# Patient Record
Sex: Male | Born: 1965 | Race: White | Hispanic: Yes | Smoking: Current every day smoker
Health system: Southern US, Community
[De-identification: ages and names within clinical notes are randomized; demographics above are authoritative.]

## PROBLEM LIST (undated history)

## (undated) ENCOUNTER — Ambulatory Visit (HOSPITAL_COMMUNITY): Admission: EM | Discharge status: Absent without leave | Payer: Non-veteran care

## (undated) DIAGNOSIS — E119 Type 2 diabetes mellitus without complications: Secondary | ICD-10-CM

## (undated) DIAGNOSIS — I10 Essential (primary) hypertension: Secondary | ICD-10-CM

## (undated) DIAGNOSIS — R079 Chest pain, unspecified: Secondary | ICD-10-CM

## (undated) DIAGNOSIS — E785 Hyperlipidemia, unspecified: Secondary | ICD-10-CM

## (undated) DIAGNOSIS — N200 Calculus of kidney: Secondary | ICD-10-CM

## (undated) DIAGNOSIS — E876 Hypokalemia: Secondary | ICD-10-CM

## (undated) DIAGNOSIS — I160 Hypertensive urgency: Secondary | ICD-10-CM

## (undated) HISTORY — DX: Essential (primary) hypertension: I10

## (undated) HISTORY — DX: Chest pain, unspecified: R07.9

## (undated) HISTORY — DX: Type 2 diabetes mellitus without complications: E11.9

## (undated) HISTORY — DX: Hypertensive urgency: I16.0

## (undated) HISTORY — PX: CHOLECYSTECTOMY: SHX55

## (undated) HISTORY — DX: Hypokalemia: E87.6

---

## 2018-05-04 ENCOUNTER — Encounter (HOSPITAL_COMMUNITY): Payer: Self-pay | Admitting: Emergency Medicine

## 2018-05-04 ENCOUNTER — Emergency Department (HOSPITAL_COMMUNITY)
Admission: EM | Admit: 2018-05-04 | Discharge: 2018-05-05 | Disposition: A | Discharge status: Home / Self Care | Payer: Non-veteran care | Attending: Emergency Medicine | Admitting: Emergency Medicine

## 2018-05-04 DIAGNOSIS — Z7984 Long term (current) use of oral hypoglycemic drugs: Secondary | ICD-10-CM | POA: Insufficient documentation

## 2018-05-04 DIAGNOSIS — I10 Essential (primary) hypertension: Secondary | ICD-10-CM | POA: Diagnosis not present

## 2018-05-04 DIAGNOSIS — M545 Low back pain, unspecified: Secondary | ICD-10-CM

## 2018-05-04 DIAGNOSIS — E1165 Type 2 diabetes mellitus with hyperglycemia: Secondary | ICD-10-CM | POA: Diagnosis not present

## 2018-05-04 DIAGNOSIS — F17210 Nicotine dependence, cigarettes, uncomplicated: Secondary | ICD-10-CM | POA: Insufficient documentation

## 2018-05-04 DIAGNOSIS — R739 Hyperglycemia, unspecified: Secondary | ICD-10-CM

## 2018-05-04 NOTE — ED Triage Notes (Signed)
Patient reports left lower back pain for 1 weeks , denies injury or fall , no dysuria or hematuria , pain increases with movement / changing positions .

## 2018-05-05 LAB — BASIC METABOLIC PANEL WITH GFR
Anion gap: 10 (ref 5–15)
BUN: 9 mg/dL (ref 6–20)
CO2: 23 mmol/L (ref 22–32)
Calcium: 9.2 mg/dL (ref 8.9–10.3)
Chloride: 102 mmol/L (ref 98–111)
Creatinine, Ser: 0.74 mg/dL (ref 0.61–1.24)
GFR calc Af Amer: >60 mL/min (ref >60)
GFR calc non Af Amer: >60 mL/min (ref >60)
Glucose, Bld: 249 mg/dL — ABNORMAL HIGH (ref 70–99)
Potassium: 3.3 mmol/L — ABNORMAL LOW (ref 3.5–5.1)
Sodium: 135 mmol/L (ref 135–145)

## 2018-05-05 LAB — CBC WITH DIFFERENTIAL/PLATELET
ABS IMMATURE GRANULOCYTES: 0.05 10*3/uL (ref 0.00–0.07)
BASOS ABS: 0.1 10*3/uL (ref 0.0–0.1)
Basophils Relative: 1 %
Eosinophils Absolute: 0.1 10*3/uL (ref 0.0–0.5)
Eosinophils Relative: 1 %
HEMATOCRIT: 50.4 % (ref 39.0–52.0)
HEMOGLOBIN: 17.3 g/dL — AB (ref 13.0–17.0)
IMMATURE GRANULOCYTES: 1 %
LYMPHS ABS: 2.9 10*3/uL (ref 0.7–4.0)
LYMPHS PCT: 28 %
MCH: 29.1 pg (ref 26.0–34.0)
MCHC: 34.3 g/dL (ref 30.0–36.0)
MCV: 84.8 fL (ref 80.0–100.0)
MONOS PCT: 8 %
Monocytes Absolute: 0.9 10*3/uL (ref 0.1–1.0)
NEUTROS ABS: 6.5 10*3/uL (ref 1.7–7.7)
NEUTROS PCT: 61 %
NRBC: 0 % (ref 0.0–0.2)
Platelets: 410 10*3/uL — ABNORMAL HIGH (ref 150–400)
RBC: 5.94 MIL/uL — ABNORMAL HIGH (ref 4.22–5.81)
RDW: 12.2 % (ref 11.5–15.5)
WBC: 10.6 10*3/uL — ABNORMAL HIGH (ref 4.0–10.5)

## 2018-05-05 LAB — URINALYSIS, ROUTINE W REFLEX MICROSCOPIC
BACTERIA UA: NONE SEEN
Bilirubin Urine: NEGATIVE
Hgb urine dipstick: NEGATIVE
KETONES UR: 20 mg/dL — AB
LEUKOCYTES UA: NEGATIVE
Nitrite: NEGATIVE
PH: 5 (ref 5.0–8.0)
Protein, ur: 30 mg/dL — AB
SPECIFIC GRAVITY, URINE: 1.038 — AB (ref 1.005–1.030)

## 2018-05-05 MED ORDER — LIDOCAINE 5 % EX PTCH: 1.0000 | MEDICATED_PATCH | CUTANEOUS | 0 refills | Status: DC

## 2018-05-05 MED ORDER — METHOCARBAMOL 500 MG PO TABS: 500.0000 mg | ORAL_TABLET | Freq: Three times a day (TID) | ORAL | 0 refills | Status: DC | PRN

## 2018-05-05 MED ORDER — LIDOCAINE 5 % EX PTCH
1.0000 | MEDICATED_PATCH | CUTANEOUS | Status: DC
  Administered 2018-05-05: 1 via TRANSDERMAL
  Filled 2018-05-05: qty 1

## 2018-05-05 MED ORDER — NAPROXEN 500 MG PO TABS: 500.0000 mg | ORAL_TABLET | Freq: Two times a day (BID) | ORAL | 0 refills | Status: DC | PRN

## 2018-05-05 MED ORDER — METFORMIN HCL 500 MG PO TABS: 1000.0000 mg | ORAL_TABLET | Freq: Two times a day (BID) | ORAL | 1 refills | Status: DC

## 2018-05-05 MED ORDER — METHOCARBAMOL 500 MG PO TABS
500.0000 mg | ORAL_TABLET | Freq: Once | ORAL | Status: AC
  Administered 2018-05-05: 500 mg via ORAL
  Filled 2018-05-05: qty 1

## 2018-05-05 MED ORDER — OXYCODONE-ACETAMINOPHEN 5-325 MG PO TABS
1.0000 | ORAL_TABLET | Freq: Once | ORAL | Status: AC
  Administered 2018-05-05: 1 via ORAL
  Filled 2018-05-05: qty 1

## 2018-05-05 MED ORDER — KETOROLAC TROMETHAMINE 60 MG/2ML IM SOLN
60.0000 mg | Freq: Once | INTRAMUSCULAR | Status: AC
  Administered 2018-05-05: 60 mg via INTRAMUSCULAR
  Filled 2018-05-05: qty 2

## 2018-05-05 NOTE — Discharge Instructions (Signed)
Alternate ice and heat to areas of injury 3-4 times per day to limit inflammation and spasm.  Avoid strenuous activity and heavy lifting.  We recommend consistent use of naproxen in addition to Robaxin for muscle spasms.  Do not drive or drink alcohol after taking Robaxin as it may make you drowsy and impair your judgment.  You have also been prescribed topical lidoderm patches for pain control.  We recommend follow-up with a primary care doctor to ensure resolution of symptoms.  Return to the ED for any new or concerning symptoms.

## 2018-05-05 NOTE — ED Provider Notes (Signed)
[facility] EMERGENCY DEPARTMENT Provider Note   CSN: [id number] Arrival date & time: 05/04/18  2334     History   Chief Complaint Chief Complaint  Patient presents with  . Back Pain    HPI Dave Moran is a 52 y.o. male.  52 year old male with history of hypertension and diabetes presents to the emergency department for evaluation of left lower back pain.  Reports that pain is been present for the past week.  It is aggravated with certain movements as well as ambulation.  He has also noted worsening pain with palpation to the area.  The patient has tried taking acetaminophen for symptoms without relief.  Denies any fall, injury, trauma.  Does not frequently engage in heavy lifting, but is on his feet a lot at work.  Denies history of chronic low back pain.  He has not had any fevers, nausea, vomiting, dysuria, hematuria, bowel or bladder incontinence.  No history of cancer or IV drug use.  He recently relocated to the area and has yet to establish care with a PCP.  The history is provided by the patient. No language interpreter was used.  Back Pain      Past Medical History:  Diagnosis Date  . Diabetes mellitus without complication (HCC)   . Hypertension     There are no active problems to display for this patient.   Past Surgical History:  Procedure Laterality Date  . CHOLECYSTECTOMY        Home Medications    Prior to Admission medications   Medication Sig Start Date End Date Taking? Authorizing Provider  lidocaine (LIDODERM) 5 % Place 1 patch onto the skin daily. Remove & Discard patch within 12 hours or as directed by MD 05/05/18   Antony Madura, PA-C  metFORMIN (GLUCOPHAGE) 500 MG tablet Take 2 tablets (1,000 mg total) by mouth 2 (two) times daily with a meal. 05/05/18   Antony Madura, PA-C  methocarbamol (ROBAXIN) 500 MG tablet Take 1 tablet (500 mg total) by mouth every 8 (eight) hours as needed for muscle spasms. 05/05/18   Antony Madura, PA-C    naproxen (NAPROSYN) 500 MG tablet Take 1 tablet (500 mg total) by mouth every 12 (twelve) hours as needed for mild pain or moderate pain. 05/05/18   Antony Madura, PA-C    Family History No family history on file.  Social History Social History   Tobacco Use  . Smoking status: Current Every Day Smoker  . Smokeless tobacco: Never Used  Substance Use Topics  . Alcohol use: Yes  . Drug use: Never     Allergies   Patient has no known allergies.   Review of Systems Review of Systems  Musculoskeletal: Positive for back pain.  Ten systems reviewed and are negative for acute change, except as noted in the HPI.    Physical Exam Updated Vital Signs BP 132/88 (BP Location: Left Arm)   Pulse 75   Temp 98.8 F (37.1 C) (Oral)   Resp 16   SpO2 98%   Physical Exam  Constitutional: He is oriented to person, place, and time. He appears well-developed and well-nourished. No distress.  Nontoxic appearing and in NAD  HENT:  Head: Normocephalic and atraumatic.  Eyes: Conjunctivae and EOM are normal. No scleral icterus.  Neck: Normal range of motion.  Pulmonary/Chest: Effort normal. No respiratory distress.  Respirations even and unlabored.  Musculoskeletal: Normal range of motion.  TTP to the left lumbar paraspinal muscles. No TTP to the  lumbosacral midline. No bony deformities, step offs, crepitus. Negative straight leg raise and crossed straight leg raise.  Neurological: He is alert and oriented to person, place, and time. He exhibits normal muscle tone. Coordination normal.  Ambulatory with antalgic gait. Sensation to light touch intact.  Skin: Skin is warm and dry. No rash noted. He is not diaphoretic. No erythema. No pallor.  Psychiatric: He has a normal mood and affect. His behavior is normal.  Nursing note and vitals reviewed.    ED Treatments / Results  Labs (all labs ordered are listed, but only abnormal results are displayed) Labs Reviewed  URINALYSIS, ROUTINE W  REFLEX MICROSCOPIC - Abnormal; Notable for the following components:      Result Value   Specific Gravity, Urine 1.038 (*)    Glucose, UA >=500 (*)    Ketones, ur 20 (*)    Protein, ur 30 (*)    All other components within normal limits  CBC WITH DIFFERENTIAL/PLATELET - Abnormal; Notable for the following components:   WBC 10.6 (*)    RBC 5.94 (*)    Hemoglobin 17.3 (*)    Platelets 410 (*)    All other components within normal limits  BASIC METABOLIC PANEL - Abnormal; Notable for the following components:   Potassium 3.3 (*)    Glucose, Bld 249 (*)    All other components within normal limits    EKG None  Radiology No results found.  Procedures Procedures (including critical care time)  Medications Ordered in ED Medications  lidocaine (LIDODERM) 5 % 1 patch (1 patch Transdermal Patch Applied 05/05/18 0214)  oxyCODONE-acetaminophen (PERCOCET/ROXICET) 5-325 MG per tablet 1 tablet (1 tablet Oral Given 05/05/18 0214)  ketorolac (TORADOL) injection 60 mg (60 mg Intramuscular Given 05/05/18 0214)  methocarbamol (ROBAXIN) tablet 500 mg (500 mg Oral Given 05/05/18 0214)    2:31 AM Patient reports some improvement with symptomatic medications. Will discharge with outpatient supportive care instructions.   Initial Impression / Assessment and Plan / ED Course  I have reviewed the triage vital signs and the nursing notes.  Pertinent labs & imaging results that were available during my care of the patient were reviewed by me and considered in my medical decision making (see chart for details).     Patient with back pain x 1 week.  Patient neurovascularly intact.  He can walk but states is painful.  No loss of bowel or bladder control.  No concern for cauda equina.  No fever, h/o cancer, IVDU.  Symptoms improving in the ED with IM and PO medications.  RICE protocol and pain medicine indicated and discussed with patient.  Return precautions discussed and provided. Patient discharged  in stable condition with no unaddressed concerns.  Vitals:   05/04/18 2354 05/05/18 0246  BP: (!) 176/102 132/88  Pulse: 80 75  Resp: 16 16  Temp: 98.8 F (37.1 C)   TempSrc: Oral   SpO2: 100% 98%    Final Clinical Impressions(s) / ED Diagnoses   Final diagnoses:  Acute left-sided low back pain without sciatica  Hyperglycemia    ED Discharge Orders         Ordered    naproxen (NAPROSYN) 500 MG tablet  Every 12 hours PRN     05/05/18 0233    methocarbamol (ROBAXIN) 500 MG tablet  Every 8 hours PRN     05/05/18 0233    lidocaine (LIDODERM) 5 %  Every 24 hours     05/05/18 0233    metFORMIN (  GLUCOPHAGE) 500 MG tablet  2 times daily with meals     05/05/18 0233           Antony Madura, PA-C 05/05/18 0259    Gilda Crease, MD 05/05/18 0700

## 2018-07-09 ENCOUNTER — Other Ambulatory Visit: Payer: Self-pay

## 2018-07-09 ENCOUNTER — Observation Stay (HOSPITAL_COMMUNITY)
Admission: EM | Admit: 2018-07-09 | Discharge: 2018-07-10 | Disposition: A | Discharge status: Home / Self Care | Payer: 59 | Attending: Family Medicine | Admitting: Family Medicine

## 2018-07-09 ENCOUNTER — Encounter (HOSPITAL_COMMUNITY): Payer: Self-pay | Admitting: Emergency Medicine

## 2018-07-09 ENCOUNTER — Emergency Department (HOSPITAL_COMMUNITY): Payer: 59

## 2018-07-09 DIAGNOSIS — Z79899 Other long term (current) drug therapy: Secondary | ICD-10-CM | POA: Diagnosis not present

## 2018-07-09 DIAGNOSIS — Z791 Long term (current) use of non-steroidal anti-inflammatories (NSAID): Secondary | ICD-10-CM | POA: Insufficient documentation

## 2018-07-09 DIAGNOSIS — E119 Type 2 diabetes mellitus without complications: Secondary | ICD-10-CM | POA: Diagnosis not present

## 2018-07-09 DIAGNOSIS — R079 Chest pain, unspecified: Principal | ICD-10-CM

## 2018-07-09 DIAGNOSIS — E876 Hypokalemia: Secondary | ICD-10-CM | POA: Diagnosis not present

## 2018-07-09 DIAGNOSIS — Z9114 Patient's other noncompliance with medication regimen: Secondary | ICD-10-CM | POA: Diagnosis not present

## 2018-07-09 DIAGNOSIS — F17210 Nicotine dependence, cigarettes, uncomplicated: Secondary | ICD-10-CM | POA: Diagnosis not present

## 2018-07-09 DIAGNOSIS — F1411 Cocaine abuse, in remission: Secondary | ICD-10-CM | POA: Diagnosis not present

## 2018-07-09 DIAGNOSIS — E785 Hyperlipidemia, unspecified: Secondary | ICD-10-CM | POA: Diagnosis not present

## 2018-07-09 DIAGNOSIS — Z716 Tobacco abuse counseling: Secondary | ICD-10-CM | POA: Insufficient documentation

## 2018-07-09 DIAGNOSIS — I119 Hypertensive heart disease without heart failure: Secondary | ICD-10-CM | POA: Insufficient documentation

## 2018-07-09 DIAGNOSIS — R9431 Abnormal electrocardiogram [ECG] [EKG]: Secondary | ICD-10-CM | POA: Diagnosis not present

## 2018-07-09 DIAGNOSIS — I160 Hypertensive urgency: Secondary | ICD-10-CM

## 2018-07-09 DIAGNOSIS — I10 Essential (primary) hypertension: Secondary | ICD-10-CM | POA: Diagnosis not present

## 2018-07-09 DIAGNOSIS — I445 Left posterior fascicular block: Secondary | ICD-10-CM | POA: Insufficient documentation

## 2018-07-09 DIAGNOSIS — F17200 Nicotine dependence, unspecified, uncomplicated: Secondary | ICD-10-CM | POA: Diagnosis not present

## 2018-07-09 DIAGNOSIS — Z7984 Long term (current) use of oral hypoglycemic drugs: Secondary | ICD-10-CM | POA: Insufficient documentation

## 2018-07-09 HISTORY — DX: Chest pain, unspecified: R07.9

## 2018-07-09 LAB — BASIC METABOLIC PANEL
ANION GAP: 10 (ref 5–15)
BUN: 10 mg/dL (ref 6–20)
CO2: 22 mmol/L (ref 22–32)
Calcium: 8.7 mg/dL — ABNORMAL LOW (ref 8.9–10.3)
Chloride: 101 mmol/L (ref 98–111)
Creatinine, Ser: 0.69 mg/dL (ref 0.61–1.24)
GFR calc Af Amer: >60 mL/min (ref >60)
Glucose, Bld: 396 mg/dL — ABNORMAL HIGH (ref 70–99)
POTASSIUM: 3.4 mmol/L — AB (ref 3.5–5.1)
SODIUM: 133 mmol/L — AB (ref 135–145)

## 2018-07-09 LAB — RAPID URINE DRUG SCREEN, HOSP PERFORMED
Amphetamines: NOT DETECTED
BENZODIAZEPINES: NOT DETECTED
Barbiturates: NOT DETECTED
Cocaine: NOT DETECTED
Opiates: NOT DETECTED
Tetrahydrocannabinol: NOT DETECTED

## 2018-07-09 LAB — CBC
HCT: 48.3 % (ref 39.0–52.0)
HEMOGLOBIN: 16.8 g/dL (ref 13.0–17.0)
MCH: 29.2 pg (ref 26.0–34.0)
MCHC: 34.8 g/dL (ref 30.0–36.0)
MCV: 84 fL (ref 80.0–100.0)
Platelets: 418 10*3/uL — ABNORMAL HIGH (ref 150–400)
RBC: 5.75 MIL/uL (ref 4.22–5.81)
RDW: 12.1 % (ref 11.5–15.5)
WBC: 9.7 10*3/uL (ref 4.0–10.5)
nRBC: 0 % (ref 0.0–0.2)

## 2018-07-09 LAB — POCT I-STAT TROPONIN I: TROPONIN I, POC: 0.01 ng/mL (ref 0.00–0.08)

## 2018-07-09 LAB — D-DIMER, QUANTITATIVE: D-Dimer, Quant: 0.37 ug/mL-FEU (ref 0.00–0.50)

## 2018-07-09 LAB — GLUCOSE, CAPILLARY: Glucose-Capillary: 267 mg/dL — ABNORMAL HIGH (ref 70–99)

## 2018-07-09 LAB — TROPONIN I

## 2018-07-09 MED ORDER — ATORVASTATIN CALCIUM 40 MG PO TABS
40.0000 mg | ORAL_TABLET | Freq: Every day | ORAL | Status: DC
  Administered 2018-07-10: 40 mg via ORAL
  Filled 2018-07-09: qty 1

## 2018-07-09 MED ORDER — ENOXAPARIN SODIUM 40 MG/0.4ML ~~LOC~~ SOLN
40.0000 mg | SUBCUTANEOUS | Status: DC
  Administered 2018-07-09: 40 mg via SUBCUTANEOUS
  Filled 2018-07-09: qty 0.4

## 2018-07-09 MED ORDER — INSULIN ASPART 100 UNIT/ML ~~LOC~~ SOLN
0.0000 [IU] | Freq: Three times a day (TID) | SUBCUTANEOUS | Status: DC
  Administered 2018-07-10: 2 [IU] via SUBCUTANEOUS
  Administered 2018-07-10: 7 [IU] via SUBCUTANEOUS

## 2018-07-09 MED ORDER — MORPHINE SULFATE (PF) 2 MG/ML IV SOLN
2.0000 mg | INTRAVENOUS | Status: DC | PRN
  Administered 2018-07-09 – 2018-07-10 (×3): 2 mg via INTRAVENOUS
  Filled 2018-07-09 (×3): qty 1

## 2018-07-09 MED ORDER — ASPIRIN 81 MG PO CHEW
324.0000 mg | CHEWABLE_TABLET | Freq: Once | ORAL | Status: AC
  Administered 2018-07-09: 324 mg via ORAL
  Filled 2018-07-09: qty 4

## 2018-07-09 MED ORDER — HYDRALAZINE HCL 20 MG/ML IJ SOLN
5.0000 mg | Freq: Four times a day (QID) | INTRAMUSCULAR | Status: DC | PRN
  Administered 2018-07-10: 5 mg via INTRAVENOUS
  Filled 2018-07-09: qty 1

## 2018-07-09 MED ORDER — SODIUM CHLORIDE 0.9 % IV SOLN
INTRAVENOUS | Status: DC
  Administered 2018-07-09 – 2018-07-10 (×2): via INTRAVENOUS

## 2018-07-09 MED ORDER — POTASSIUM CHLORIDE CRYS ER 20 MEQ PO TBCR
40.0000 meq | EXTENDED_RELEASE_TABLET | Freq: Once | ORAL | Status: AC
  Administered 2018-07-09: 40 meq via ORAL
  Filled 2018-07-09: qty 2

## 2018-07-09 MED ORDER — NITROGLYCERIN 0.4 MG SL SUBL
0.4000 mg | SUBLINGUAL_TABLET | SUBLINGUAL | Status: DC | PRN
  Administered 2018-07-09: 0.4 mg via SUBLINGUAL
  Filled 2018-07-09: qty 1

## 2018-07-09 NOTE — Consult Note (Addendum)
Cardiology Consultation:   Patient ID: Dave Moran MRN: [id number]; DOB: 1966-01-12  Admit date: 07/09/2018 Date of Consult: 07/09/2018  Primary Care Provider: Patient, No Pcp Per Primary Cardiologist: None  Patient Profile:   Dave Moran is a 52 y.o. male with a hx of DM, HTN, who is being seen today for the evaluation of chest pain at the request of Dr. Clarene DukeLittle.  History of Present Illness:   Dave Moran is a 52 y.o. male with a hx of DM, HTN, who is being seen today for the evaluation of chest pain.  The patient has no known cardiac history. [place]. He states that several years ago he had a prior stress test that sounds to have had minor abnormalities by his description; he did not undergo subsequent cardiac catheterization. He has been treated previously with oral medications for his diabetes and HTN, but he has not taken any medicines in several months. He smokes 1 pack of cigarettes per week and drinks 12 beers over a weekend. He does endorse a single lifetime use of cocaine 2 weeks ago.   He presented to the ED with chest pain that began yesterday. He reports that the pain started when he was at work at an [employer] store- he was not doing any strenuous activity at the time. He describes the pain to be a throbbing sensation in his chest with radiation down his right arm. He is unsure of any aggravating factors, and he reports that his pain is better with relaxation. He has had some minor dyspnea but denies nausea or diaphoresis. He has had some dizziness and headache without any syncope. He denies any prior chest pain like this in the past.   [facility], where he had SBP 160-180s, HR 70-90s. ECG showed NSR with nonspecific ST changes. Troponin was negative x1. CXR showed no acute changes. He was admitted to the hospitalist service, and cardiology was consulted for further recommendations. On my evaluation, the patient  has SBP in the 160s. He states that he continues to have chest pain, which has not changed since last night. He has not noticed any change in his pain with the NTG that he received.   Past Medical History:  Diagnosis Date  . Diabetes mellitus without complication (HCC)   . Hypertension     Past Surgical History:  Procedure Laterality Date  . CHOLECYSTECTOMY       Home Medications:  Prior to Admission medications   Medication Sig Start Date End Date Taking? Authorizing Provider  ibuprofen (ADVIL,MOTRIN) 200 MG tablet Take 400 mg by mouth every 6 (six) hours as needed for moderate pain.   Yes [provider]  lidocaine (LIDODERM) 5 % Place 1 patch onto the skin daily. Remove & Discard patch within 12 hours or as directed by MD Patient not taking: Reported on 07/09/2018 05/05/18   Antony MaduraHumes, Kelly, PA-C  metFORMIN (GLUCOPHAGE) 500 MG tablet Take 2 tablets (1,000 mg total) by mouth 2 (two) times daily with a meal. Patient not taking: Reported on 07/09/2018 05/05/18   Antony MaduraHumes, Kelly, PA-C  methocarbamol (ROBAXIN) 500 MG tablet Take 1 tablet (500 mg total) by mouth every 8 (eight) hours as needed for muscle spasms. Patient not taking: Reported on 07/09/2018 05/05/18   Antony MaduraHumes, Kelly, PA-C  naproxen (NAPROSYN) 500 MG tablet Take 1 tablet (500 mg total) by mouth every 12 (twelve) hours as needed for mild pain or moderate pain.  Patient not taking: Reported on 07/09/2018 05/05/18   Antony MaduraHumes, Kelly, PA-C    Inpatient Medications: Scheduled Meds: . Melene Muller[START ON 07/10/2018] atorvastatin  40 mg Oral q1800   Continuous Infusions:  PRN Meds: hydrALAZINE, nitroGLYCERIN  Allergies:   No Known Allergies  Social History:   Social History   Socioeconomic History  . Marital status: Significant Other    Spouse name: Not on file  . Number of children: Not on file  . Years of education: Not on file  . Highest education level: Not on file  Occupational History  . Not on file  Social Needs  .  Financial resource strain: Not on file  . Food insecurity:    Worry: Not on file    Inability: Not on file  . Transportation needs:    Medical: Not on file    Non-medical: Not on file  Tobacco Use  . Smoking status: Current Every Day Smoker  . Smokeless tobacco: Never Used  Substance and Sexual Activity  . Alcohol use: Yes  . Drug use: Never  . Sexual activity: Not on file  Lifestyle  . Physical activity:    Days per week: Not on file    Minutes per session: Not on file  . Stress: Not on file  Relationships  . Social connections:    Talks on phone: Not on file    Gets together: Not on file    Attends religious service: Not on file    Active member of club or organization: Not on file    Attends meetings of clubs or organizations: Not on file    Relationship status: Not on file  . Intimate partner violence:    Fear of current or ex partner: Not on file    Emotionally abused: Not on file    Physically abused: Not on file    Forced sexual activity: Not on file  Other Topics Concern  . Not on file  Social History Narrative  . Not on file    Family History:   Reports MI in GF in his 6790s.  ROS:  Please see the history of present illness.  All other ROS reviewed and negative.     Physical Exam/Data:   Vitals:   07/09/18 1257 07/09/18 1502 07/09/18 1557 07/09/18 1705  BP: (!) 181/118 (!) 166/109  (!) 182/109  Pulse: 98 85  76  Resp: 16 18  16   Temp: 98.1 F (36.7 C)     TempSrc: Oral     SpO2: 100% 100%  98%  Weight:   81.6 kg   Height:   5\' 7"  (1.702 m)    No intake or output data in the 24 hours ending 07/09/18 1915 Filed Weights   07/09/18 1557  Weight: 81.6 kg   Body mass index is 28.19 kg/m.  General:  Well nourished, well developed, in no acute distress HEENT: normal Lymph: no adenopathy Neck: no JVD Cardiac:  normal S1, S2; RRR; no murmur  Lungs:  clear to auscultation bilaterally, no wheezing, rhonchi or rales  Abd: soft, nontender  Ext: no  edema Musculoskeletal:  No deformities, BUE and BLE strength normal and equal Skin: warm and dry  Neuro:  No focal abnormalities noted Psych:  Normal affect   EKG:  The EKG was personally reviewed and demonstrates:  NSR with nonspecific ST changes Telemetry:  Telemetry in room was personally reviewed and demonstrates: sinus rhythm  Relevant CV Studies: None in system  Laboratory Data:  Chemistry Recent Labs  Lab 07/09/18 1312  NA 133*  K 3.4*  CL 101  CO2 22  GLUCOSE 396*  BUN 10  CREATININE 0.69  CALCIUM 8.7*  GFRNONAA >60  GFRAA >60  ANIONGAP 10    No results for input(s): PROT, ALBUMIN, AST, ALT, ALKPHOS, BILITOT in the last 168 hours. Hematology Recent Labs  Lab 07/09/18 1312  WBC 9.7  RBC 5.75  HGB 16.8  HCT 48.3  MCV 84.0  MCH 29.2  MCHC 34.8  RDW 12.1  PLT 418*   Cardiac EnzymesNo results for input(s): TROPONINI in the last 168 hours.  Recent Labs  Lab 07/09/18 1316  TROPIPOC 0.01    BNPNo results for input(s): BNP, PROBNP in the last 168 hours.  DDimer No results for input(s): DDIMER in the last 168 hours.  Radiology/Studies:  Dg Chest 2 View  Result Date: 07/09/2018 CLINICAL DATA:  Chest pain onset today as well as throbbing sensation to right arm. H/o HTN and Angina per patient. Also c/o headache this a.m. EXAM: CHEST - 2 VIEW COMPARISON:  None. FINDINGS: The heart size and mediastinal contours are within normal limits. Both lungs are clear. The visualized skeletal structures are unremarkable. IMPRESSION: No active cardiopulmonary disease. Electronically Signed   By: Norva Pavlov M.D.   On: 07/09/2018 14:14    Assessment and Plan:   Chest pain The patient has no known cardiac history, although he does have multiple risk factors for CAD. He presents with chest pain that has been persistent for nearly 24 hours. His pain is atypical by description, and troponin is negative x1. ECG does show nonspecific changes. His presentation is not typical  for ACS. However, he would benefit from repeat ischemic evaluation at some point. It is possible that his significantly elevated BP may be contributing to his CP.  -Continue to trend troponin, ECG -Agree with echocardiogram -Continue HTN control and workup of other causes of chest pain (eg PE) -Continue ASA 81 mg daily for now. Starting atorvastatin -HgA1c, lipid panel ordered.  -We will follow along to determine the timing and type of ischemic evaluation, likely stress test if troponin remains negative  HTN As above, elevated BP may be contributing to CP -Continue BP management per primary team      For questions or updates, please contact [facility] Please consult [email or web address] for contact info under     Signed, Ernest Mallick, MD  07/09/2018 7:15 PM

## 2018-07-09 NOTE — H&P (Signed)
History and Physical  Dave MooresJose Peeples ZOX:[id number] DOB: 1966-02-28 DOA: 07/09/2018  Referring physician: EDP PCP: Patient, No Pcp Per   Chief Complaint: chest pain  HPI: Dave MooresJose Moran is a 52 y.o. male   H/o HTN, DM2, smoker presented to the ED due to chest pain started yesterday, pain is constant, asa helped, nitroglycerin did not, he does reports feeling sob, no cough, no edema, no dizziness. He also reports right shoulder and right arm pain started yesterday.   ED course: blood pressure elevated, no fever, no hypoxia, no tachycardia, troponin negative, sodium 133, k, 3.4, blood glucose 396. cxr no acute findings, EKG Sinus rhythm, t was flattening inferior lateral leads. He reports has not taken any bp meds or diabetes meds for at least several months. He does admit to using cocaine two weeks ago. Reports pins and needles feeling in his feet for the last three months  EDP consulted cardiology for chest pain, hospitalist called to further manage the patient.   Review of Systems:  Detail per HPI, Review of systems are otherwise negative  Past Medical History:  Diagnosis Date  . Diabetes mellitus without complication (HCC)   . Hypertension    Past Surgical History:  Procedure Laterality Date  . CHOLECYSTECTOMY     Social History:  reports that he has been smoking. He has never used smokeless tobacco. He reports current alcohol use. He reports that he does not use drugs. Patient lives at home & is able to participate in activities of daily living independently   No Known Allergies  History reviewed. No pertinent family history.    Prior to Admission medications   Medication Sig Start Date End Date Taking? Authorizing Provider  ibuprofen (ADVIL,MOTRIN) 200 MG tablet Take 400 mg by mouth every 6 (six) hours as needed for moderate pain.   Yes [provider]  lidocaine (LIDODERM) 5 % Place 1 patch onto the skin daily. Remove & Discard patch within 12 hours or as directed  by MD Patient not taking: Reported on 07/09/2018 05/05/18   Antony MaduraHumes, Kelly, PA-C  metFORMIN (GLUCOPHAGE) 500 MG tablet Take 2 tablets (1,000 mg total) by mouth 2 (two) times daily with a meal. Patient not taking: Reported on 07/09/2018 05/05/18   Antony MaduraHumes, Kelly, PA-C  methocarbamol (ROBAXIN) 500 MG tablet Take 1 tablet (500 mg total) by mouth every 8 (eight) hours as needed for muscle spasms. Patient not taking: Reported on 07/09/2018 05/05/18   Antony MaduraHumes, Kelly, PA-C  naproxen (NAPROSYN) 500 MG tablet Take 1 tablet (500 mg total) by mouth every 12 (twelve) hours as needed for mild pain or moderate pain. Patient not taking: Reported on 07/09/2018 05/05/18   Antony MaduraHumes, Kelly, PA-C    Physical Exam: BP (!) 182/109   Pulse 76   Temp 98.1 F (36.7 C) (Oral)   Resp 16   Ht 5\' 7"  (1.702 m)   Wt 81.6 kg   SpO2 98%   BMI 28.19 kg/m   General:  NAD Eyes: PERRL ENT: unremarkable Neck: supple, no JVD Cardiovascular: RRR, center chest wall tenderness, no erythema, no edema,  Respiratory: CTABL Abdomen: soft/NT/ND, positive bowel sounds Skin: no rash Musculoskeletal:  No edema, right arm not able to lift above right shoulder due to pain, radial pulse intact. Psychiatric: calm/cooperative Neurologic: no focal findings            Labs on Admission:  Basic Metabolic Panel: Recent Labs  Lab 07/09/18 1312  NA 133*  K 3.4*  CL 101  CO2 22  GLUCOSE 396*  BUN 10  CREATININE 0.69  CALCIUM 8.7*   Liver Function Tests: No results for input(s): AST, ALT, ALKPHOS, BILITOT, PROT, ALBUMIN in the last 168 hours. No results for input(s): LIPASE, AMYLASE in the last 168 hours. No results for input(s): AMMONIA in the last 168 hours. CBC: Recent Labs  Lab 07/09/18 1312  WBC 9.7  HGB 16.8  HCT 48.3  MCV 84.0  PLT 418*   Cardiac Enzymes: No results for input(s): CKTOTAL, CKMB, CKMBINDEX, TROPONINI in the last 168 hours.  BNP (last 3 results) No results for input(s): BNP in the last 8760  hours.  ProBNP (last 3 results) No results for input(s): PROBNP in the last 8760 hours.  CBG: No results for input(s): GLUCAP in the last 168 hours.  Radiological Exams on Admission: Dg Chest 2 View  Result Date: 07/09/2018 CLINICAL DATA:  Chest pain onset today as well as throbbing sensation to right arm. H/o HTN and Angina per patient. Also c/o headache this a.m. EXAM: CHEST - 2 VIEW COMPARISON:  None. FINDINGS: The heart size and mediastinal contours are within normal limits. Both lungs are clear. The visualized skeletal structures are unremarkable. IMPRESSION: No active cardiopulmonary disease. Electronically Signed   By: Norva PavlovElizabeth  Brown M.D.   On: 07/09/2018 14:14    EKG: Independently reviewed. Sinus rhythm, t was flattening inferior lateral leads  Assessment/Plan Present on Admission: **None**  Intermittent central chest pain  -does has chest wall tenderness -though he does has cardiac Risk factors including hypertension, diabetes, smoker , EKG changes -troponin negativex1, continue cycle troponin, check a1c, lipid, echo -cardiology consulted  Hypertension emergency -prn hydralazine, will avoid betablocker due to h/o cocaine use  Diabetes, not controlled -has not taken any meds  -check a1c,  -start ssi here  Hypokalemia: replace k, check mag  Medication noncompliance  History of cocaine use, uds negative in the ED  Smoker: smoking cessation education provided, he reports has tried to cut down from two pack a day to one pack a week.   DVT prophylaxis: lovenox   Consultants: cardiology  Code Status: full   Family Communication:  Patient   Disposition Plan: Med telemetry, observation  Time spent: 75mins  Albertine GratesFang Ilir Mahrt MD, PhD [facility] Pager [phone] If 7PM-7AM, please contact night-coverage at [email or web address], password [email or web address]

## 2018-07-09 NOTE — ED Notes (Signed)
ED TO INPATIENT HANDOFF REPORT  Name/Age/Gender Dave Moran 52 y.o. male  Code Status   Home/SNF/Other Given to floor  Chief Complaint chest pain;right arm pain;lower back pain;headache  Level of Care/Admitting Diagnosis ED Disposition    ED Disposition Condition Comment   Admit  Hospital Area: [facility] [[id number]]  Level of Care: Telemetry [5]  Admit to tele based on following criteria: Monitor for Ischemic changes  Diagnosis: Chest pain [[id number]][[id number]]  Admitting Physician: Albertine GratesXU, FANG [[id number]]  Attending Physician: Albertine GratesXU, FANG [[id number]]  PT Class (Do Not Modify): Observation [104]  PT Acc Code (Do Not Modify): Observation [10022]       Medical History Past Medical History:  Diagnosis Date  . Diabetes mellitus without complication (HCC)   . Hypertension     Allergies No Known Allergies  IV Location/Drains/Wounds Patient Lines/Drains/Airways Status   Active Line/Drains/Airways    Name:   Placement date:   Placement time:   Site:   Days:   Peripheral IV 07/09/18 Left Antecubital   07/09/18    1716    Antecubital   less than 1          Labs/Imaging Results for orders placed or performed during the hospital encounter of 07/09/18 (from the past 48 hour(s))  Basic metabolic panel     Status: Abnormal   Collection Time: 07/09/18  1:12 PM  Result Value Ref Range   Sodium 133 (L) 135 - 145 mmol/L   Potassium 3.4 (L) 3.5 - 5.1 mmol/L   Chloride 101 98 - 111 mmol/L   CO2 22 22 - 32 mmol/L   Glucose, Bld 396 (H) 70 - 99 mg/dL   BUN 10 6 - 20 mg/dL   Creatinine, Ser 2.950.69 0.61 - 1.24 mg/dL   Calcium 8.7 (L) 8.9 - 10.3 mg/dL   GFR calc non Af Amer >60 >60 mL/min   GFR calc Af Amer >60 >60 mL/min   Anion gap 10 5 - 15    Comment: Performed at [facility], [address] [place], [place] [address]  CBC     Status: Abnormal   Collection Time: 07/09/18  1:12 PM  Result Value Ref Range   WBC 9.7 4.0 - 10.5 K/uL   RBC 5.75 4.22 - 5.81  MIL/uL   Hemoglobin 16.8 13.0 - 17.0 g/dL   HCT 86.548.3 78.439.0 - 69.652.0 %   MCV 84.0 80.0 - 100.0 fL   MCH 29.2 26.0 - 34.0 pg   MCHC 34.8 30.0 - 36.0 g/dL   RDW 29.512.1 28.411.5 - 13.215.5 %   Platelets 418 (H) 150 - 400 K/uL   nRBC 0.0 0.0 - 0.2 %    Comment: Performed at [facility], [address] [place], [place] [address]  POCT i-Stat troponin I     Status: None   Collection Time: 07/09/18  1:16 PM  Result Value Ref Range   Troponin i, poc 0.01 0.00 - 0.08 ng/mL   Comment 3            Comment: Due to the release kinetics of cTnI, a negative result within the first hours of the onset of symptoms does not rule out myocardial infarction with certainty. If myocardial infarction is still suspected, repeat the test at appropriate intervals.    Dg Chest 2 View  Result Date: 07/09/2018 CLINICAL DATA:  Chest pain onset today as well as throbbing sensation to right arm. H/o HTN and Angina per patient. Also c/o headache this  a.m. EXAM: CHEST - 2 VIEW COMPARISON:  None. FINDINGS: The heart size and mediastinal contours are within normal limits. Both lungs are clear. The visualized skeletal structures are unremarkable. IMPRESSION: No active cardiopulmonary disease. Electronically Signed   By: Norva PavlovElizabeth  Brown M.D.   On: 07/09/2018 14:14    Pending Labs Unresulted Labs (From admission, onward)    Start     Ordered   07/10/18 0500  Lipid panel  Tomorrow morning,   R     07/09/18 1752   07/10/18 0500  CBC with Differential/Platelet  Tomorrow morning,   R     07/09/18 1752   07/10/18 0500  Comprehensive metabolic panel  Tomorrow morning,   R     07/09/18 1752   07/10/18 0500  Magnesium  Tomorrow morning,   R     07/09/18 1752   07/10/18 0500  TSH  Tomorrow morning,   R     07/09/18 1752   07/10/18 0500  Hemoglobin A1c  Tomorrow morning,   R     07/09/18 1752   07/09/18 1755  D-dimer, quantitative (not at [facility])  Once,   R     07/09/18 1754   07/09/18 1751  Troponin I - Now Then  Q6H  Now then every 6 hours,   R     07/09/18 1751   07/09/18 1749  Urine rapid drug screen (hosp performed)  ONCE - STAT,   R     07/09/18 1748          Vitals/Pain Today's Vitals   07/09/18 1502 07/09/18 1557 07/09/18 1705 07/09/18 1710  BP: (!) 166/109  (!) 182/109   Pulse: 85  76   Resp: 18  16   Temp:      TempSrc:      SpO2: 100%  98%   Weight:  81.6 kg    Height:  5\' 7"  (1.702 m)    PainSc:    7     Isolation Precautions No active isolations  Medications Medications  nitroGLYCERIN (NITROSTAT) SL tablet 0.4 mg (0.4 mg Sublingual Given 07/09/18 1715)  hydrALAZINE (APRESOLINE) injection 5 mg (has no administration in time range)  aspirin chewable tablet 324 mg (324 mg Oral Given 07/09/18 1706)    Mobility walks

## 2018-07-09 NOTE — ED Triage Notes (Signed)
Pt with chest pressure since last night with throbbing into r arm. Pt has taken ibuprofen but no relief. Denies shortness of breath. C/o headache this morning.

## 2018-07-09 NOTE — ED Provider Notes (Signed)
[facility] Note   CSN: [id number] Arrival date & time: 07/09/18  1249     History   Chief Complaint Chief Complaint  Patient presents with  . Chest Pain  . Arm Pain    HPI Dave Moran is a 52 y.o. male.  52yo M w/ PMH including HTN, T2DM who p/w chest pain. Around 8pm last night at work, he began having throbbing, central CP that initially felt like heartburn. He had some squeezing pressure pain to his right arm with the CP started. He took ibuprofen 400mg  without relief. Pain was initially intermittent but has become constant, nagging pain. He denies SOB, N/V, or diaphoresis. He reports some dizziness at one point. He's had a mild headache today and was concerned his blood pressure was up.       He moved here 8 months ago and hasn't been on any medications during that time. He smokes 1/4 pack cigarettes per day. He has used cocaine before but none recently in the setting of the chest pain.       He [facility] that was normal. Grandfather died from MI.   The history is provided by the patient.  Chest Pain    Arm Pain  Associated symptoms include chest pain.    Past Medical History:  Diagnosis Date  . Diabetes mellitus without complication (HCC)   . Hypertension     There are no active problems to display for this patient.   Past Surgical History:  Procedure Laterality Date  . CHOLECYSTECTOMY          Home Medications    Prior to Admission medications   Medication Sig Start Date End Date Taking? Authorizing Provider  lidocaine (LIDODERM) 5 % Place 1 patch onto the skin daily. Remove & Discard patch within 12 hours or as directed by MD 05/05/18   Antony MaduraHumes, Kelly, PA-C  metFORMIN (GLUCOPHAGE) 500 MG tablet Take 2 tablets (1,000 mg total) by mouth 2 (two) times daily with a meal. 05/05/18   Antony MaduraHumes, Kelly, PA-C  methocarbamol (ROBAXIN) 500 MG tablet Take 1 tablet (500 mg  total) by mouth every 8 (eight) hours as needed for muscle spasms. 05/05/18   Antony MaduraHumes, Kelly, PA-C  naproxen (NAPROSYN) 500 MG tablet Take 1 tablet (500 mg total) by mouth every 12 (twelve) hours as needed for mild pain or moderate pain. 05/05/18   Antony MaduraHumes, Kelly, PA-C    Family History History reviewed. No pertinent family history.  Social History Social History   Tobacco Use  . Smoking status: Current Every Day Smoker  . Smokeless tobacco: Never Used  Substance Use Topics  . Alcohol use: Yes  . Drug use: Never     Allergies   Patient has no known allergies.   Review of Systems Review of Systems  Cardiovascular: Positive for chest pain.   All other systems reviewed and are negative except that which was mentioned in HPI   Physical Exam Updated Vital Signs BP (!) 166/109 (BP Location: Right Arm)   Pulse 85   Temp 98.1 F (36.7 C) (Oral)   Resp 18   Ht 5\' 7"  (1.702 m)   Wt 81.6 kg   SpO2 100%   BMI 28.19 kg/m   Physical Exam Vitals signs and nursing note reviewed.  Constitutional:      General: He is not in acute distress.    Appearance: He is well-developed.  HENT:  Head: Normocephalic and atraumatic.  Eyes:     Conjunctiva/sclera: Conjunctivae normal.     Pupils: Pupils are equal, round, and reactive to light.  Neck:     Musculoskeletal: Neck supple.  Cardiovascular:     Rate and Rhythm: Normal rate and regular rhythm.     Heart sounds: Murmur present. Systolic murmur present with a grade of 1/6.  Pulmonary:     Effort: Pulmonary effort is normal.     Breath sounds: Normal breath sounds.  Abdominal:     General: Bowel sounds are normal. There is no distension.     Palpations: Abdomen is soft.     Tenderness: There is no abdominal tenderness.  Skin:    General: Skin is warm and dry.  Neurological:     Mental Status: He is alert and oriented to person, place, and time.     Comments: Fluent speech  Psychiatric:        Judgment: Judgment normal.       ED Treatments / Results  Labs (all labs ordered are listed, but only abnormal results are displayed) Labs Reviewed  BASIC METABOLIC PANEL - Abnormal; Notable for the following components:      Result Value   Sodium 133 (*)    Potassium 3.4 (*)    Glucose, Bld 396 (*)    Calcium 8.7 (*)    All other components within normal limits  CBC - Abnormal; Notable for the following components:   Platelets 418 (*)    All other components within normal limits  I-STAT TROPONIN, ED  POCT I-STAT TROPONIN I    EKG EKG Interpretation  Date/Time:  Saturday July 09 2018 12:59:16 EST Ventricular Rate:  99 PR Interval:    QRS Duration: 100 QT Interval:  351 QTC Calculation: 451 R Axis:   104 Text Interpretation:  Sinus rhythm Left posterior fascicular block Borderline repolarization abnormality No old tracing to compare Confirmed by Raeford Razor [phone]) on 07/09/2018 3:52:19 PM   Radiology Dg Chest 2 View  Result Date: 07/09/2018 CLINICAL DATA:  Chest pain onset today as well as throbbing sensation to right arm. H/o HTN and Angina per patient. Also c/o headache this a.m. EXAM: CHEST - 2 VIEW COMPARISON:  None. FINDINGS: The heart size and mediastinal contours are within normal limits. Both lungs are clear. The visualized skeletal structures are unremarkable. IMPRESSION: No active cardiopulmonary disease. Electronically Signed   By: Norva Pavlov M.D.   On: 07/09/2018 14:14    Procedures Procedures (including critical care time)  Medications Ordered in ED Medications  aspirin chewable tablet 324 mg (has no administration in time range)     Initial Impression / Assessment and Plan / ED Course  I have reviewed the triage vital signs and the nursing notes.  Pertinent labs & imaging results that were available during my care of the patient were reviewed by me and considered in my medical decision making (see chart for details).    Patient was nontoxic on exam, vital  signs notable for severe hypertension.  EKG shows inferior T wave inversions, no previous available for comparison.  Gave the patient aspirin and nitroglycerin.  Chest x-ray negative acute.  Initial troponin is negative.  No risk factors for PE.  Patient is honest about his previous cocaine use and states that he has not used any in the last 2 weeks to provoke his current chest pain episode.  Given his comorbidities, age, and poorly controlled medical conditions with his abnormal EKG, his  HEART score is 5. I recommended admission for CP work up. Discussed w/ cardiology who will see pt in consultation. Discussed admission w/ [facility], Dr. Roda ShuttersXu.   Final Clinical Impressions(s) / ED Diagnoses   Final diagnoses:  None    ED Discharge Orders    None       Elson Ulbrich, Ambrose Finlandachel Morgan, MD 07/10/18 [phone]

## 2018-07-10 ENCOUNTER — Other Ambulatory Visit: Payer: Self-pay

## 2018-07-10 ENCOUNTER — Observation Stay (HOSPITAL_BASED_OUTPATIENT_CLINIC_OR_DEPARTMENT_OTHER): Payer: 59

## 2018-07-10 ENCOUNTER — Observation Stay (HOSPITAL_BASED_OUTPATIENT_CLINIC_OR_DEPARTMENT_OTHER)
Admission: EM | Admit: 2018-07-10 | Discharge: 2018-07-10 | Disposition: A | Discharge status: Home / Self Care | Payer: 59 | Source: Home / Self Care | Attending: Physician Assistant | Admitting: Physician Assistant

## 2018-07-10 DIAGNOSIS — E119 Type 2 diabetes mellitus without complications: Secondary | ICD-10-CM | POA: Diagnosis not present

## 2018-07-10 DIAGNOSIS — R079 Chest pain, unspecified: Secondary | ICD-10-CM | POA: Diagnosis not present

## 2018-07-10 DIAGNOSIS — I160 Hypertensive urgency: Secondary | ICD-10-CM

## 2018-07-10 DIAGNOSIS — I119 Hypertensive heart disease without heart failure: Secondary | ICD-10-CM | POA: Diagnosis not present

## 2018-07-10 DIAGNOSIS — I10 Essential (primary) hypertension: Secondary | ICD-10-CM | POA: Diagnosis not present

## 2018-07-10 LAB — COMPREHENSIVE METABOLIC PANEL
ALT: 17 U/L (ref 0–44)
AST: 12 U/L — ABNORMAL LOW (ref 15–41)
Albumin: 3.7 g/dL (ref 3.5–5.0)
Alkaline Phosphatase: 70 U/L (ref 38–126)
Anion gap: 9 (ref 5–15)
BUN: 10 mg/dL (ref 6–20)
CALCIUM: 8.6 mg/dL — AB (ref 8.9–10.3)
CO2: 25 mmol/L (ref 22–32)
Chloride: 103 mmol/L (ref 98–111)
Creatinine, Ser: 0.69 mg/dL (ref 0.61–1.24)
GFR calc Af Amer: >60 mL/min (ref >60)
GFR calc non Af Amer: >60 mL/min (ref >60)
Glucose, Bld: 244 mg/dL — ABNORMAL HIGH (ref 70–99)
Potassium: 3.6 mmol/L (ref 3.5–5.1)
Sodium: 137 mmol/L (ref 135–145)
Total Bilirubin: 0.6 mg/dL (ref 0.3–1.2)
Total Protein: 6.3 g/dL — ABNORMAL LOW (ref 6.5–8.1)

## 2018-07-10 LAB — CBC WITH DIFFERENTIAL/PLATELET
Abs Immature Granulocytes: 0.06 10*3/uL (ref 0.00–0.07)
BASOS ABS: 0.1 10*3/uL (ref 0.0–0.1)
Basophils Relative: 1 %
EOS PCT: 5 %
Eosinophils Absolute: 0.4 10*3/uL (ref 0.0–0.5)
HCT: 48 % (ref 39.0–52.0)
Hemoglobin: 16.3 g/dL (ref 13.0–17.0)
Immature Granulocytes: 1 %
Lymphocytes Relative: 29 %
Lymphs Abs: 2.8 10*3/uL (ref 0.7–4.0)
MCH: 29.5 pg (ref 26.0–34.0)
MCHC: 34 g/dL (ref 30.0–36.0)
MCV: 86.8 fL (ref 80.0–100.0)
MONO ABS: 0.8 10*3/uL (ref 0.1–1.0)
Monocytes Relative: 9 %
NRBC: 0 % (ref 0.0–0.2)
Neutro Abs: 5.3 10*3/uL (ref 1.7–7.7)
Neutrophils Relative %: 55 %
Platelets: 391 10*3/uL (ref 150–400)
RBC: 5.53 MIL/uL (ref 4.22–5.81)
RDW: 12.3 % (ref 11.5–15.5)
WBC: 9.4 10*3/uL (ref 4.0–10.5)

## 2018-07-10 LAB — ECHOCARDIOGRAM COMPLETE
Height: 67 in
Weight: 2880 oz

## 2018-07-10 LAB — HEMOGLOBIN A1C
Hgb A1c MFr Bld: 10.6 % — ABNORMAL HIGH (ref 4.8–5.6)
Mean Plasma Glucose: 257.52 mg/dL

## 2018-07-10 LAB — NM MYOCAR MULTI W/SPECT W/WALL MOTION / EF
Estimated workload: 1 METS
Exercise duration (min): 4 min
Exercise duration (sec): 37 s
MPHR: 169 {beats}/min
Peak HR: 100 {beats}/min
Percent HR: 59 %
Rest HR: 75 {beats}/min

## 2018-07-10 LAB — LIPID PANEL
Cholesterol: 183 mg/dL (ref 0–200)
HDL: 54 mg/dL (ref >40)
LDL Cholesterol: 101 mg/dL — ABNORMAL HIGH (ref 0–99)
Total CHOL/HDL Ratio: 3.4 RATIO
Triglycerides: 139 mg/dL (ref <150)
VLDL: 28 mg/dL (ref 0–40)

## 2018-07-10 LAB — GLUCOSE, CAPILLARY
GLUCOSE-CAPILLARY: 189 mg/dL — AB (ref 70–99)
Glucose-Capillary: 313 mg/dL — ABNORMAL HIGH (ref 70–99)

## 2018-07-10 LAB — TROPONIN I: Troponin I: <0.03 ng/mL (ref <0.03)

## 2018-07-10 LAB — MAGNESIUM: Magnesium: 1.8 mg/dL (ref 1.7–2.4)

## 2018-07-10 LAB — TSH: TSH: 1.852 u[IU]/mL (ref 0.350–4.500)

## 2018-07-10 MED ORDER — TECHNETIUM TC 99M TETROFOSMIN IV KIT
10.0000 | PACK | Freq: Once | INTRAVENOUS | Status: AC | PRN
  Administered 2018-07-10: 10 via INTRAVENOUS

## 2018-07-10 MED ORDER — ATORVASTATIN CALCIUM 40 MG PO TABS: 40.0000 mg | ORAL_TABLET | Freq: Every day | ORAL | 0 refills | Status: DC

## 2018-07-10 MED ORDER — LOSARTAN POTASSIUM 50 MG PO TABS: 50.0000 mg | ORAL_TABLET | Freq: Every day | ORAL | Status: DC

## 2018-07-10 MED ORDER — LOSARTAN POTASSIUM 50 MG PO TABS: 50.0000 mg | ORAL_TABLET | Freq: Every day | ORAL | 0 refills | Status: DC

## 2018-07-10 MED ORDER — REGADENOSON 0.4 MG/5ML IV SOLN
0.4000 mg | Freq: Once | INTRAVENOUS | Status: AC
  Administered 2018-07-10: 0.4 mg via INTRAVENOUS

## 2018-07-10 MED ORDER — GLIPIZIDE 5 MG PO TABS: 5.0000 mg | ORAL_TABLET | Freq: Two times a day (BID) | ORAL | 0 refills | Status: DC

## 2018-07-10 MED ORDER — BLOOD GLUCOSE MONITOR KIT: PACK | 0 refills | Status: DC

## 2018-07-10 MED ORDER — METFORMIN HCL 500 MG PO TABS: 500.0000 mg | ORAL_TABLET | Freq: Two times a day (BID) | ORAL | 0 refills | Status: DC

## 2018-07-10 MED ORDER — ASPIRIN 81 MG PO CHEW
81.0000 mg | CHEWABLE_TABLET | Freq: Every day | ORAL | Status: DC
  Administered 2018-07-10: 81 mg via ORAL
  Filled 2018-07-10: qty 1

## 2018-07-10 MED ORDER — REGADENOSON 0.4 MG/5ML IV SOLN
INTRAVENOUS | Status: AC
  Filled 2018-07-10: qty 5

## 2018-07-10 MED ORDER — TECHNETIUM TC 99M TETROFOSMIN IV KIT
30.0000 | PACK | Freq: Once | INTRAVENOUS | Status: AC | PRN
  Administered 2018-07-10: 30 via INTRAVENOUS

## 2018-07-10 NOTE — Discharge Summary (Signed)
Physician Discharge Summary  Hence Derrick BHA:[id number] DOB: 05-Jun-1966 DOA: 07/09/2018  PCP: Patient, No Pcp Per  Cardiology office will contact patient for outpatient appointment. Patient highly motivated to obtain PCP, he has insurance and will pursue this 12/30.  Admit date: 07/09/2018 Discharge date: 07/10/2018  Recommendations for Outpatient Follow-up:  1. Chest pain, see below 2. Routine care for diabetes and hypertension     Discharge Diagnoses:  1. Chest pain (principal) 2. Diabetes mellitus type 2, hemoglobin A1c 10.6 3. Essential hypertension with hypertensive urgency on admission 4. Hyperlipidemia 5. Cigarette smoker 6. History of cocaine use  Discharge Condition: improved Disposition: home  Diet recommendation: heart health, diabetic diet  Filed Weights   07/09/18 1557  Weight: 81.6 kg    History of present illness:  52 year old man PMH hypertension, diabetes mellitus type 2 presented with chest pain.  He had been off all his medications for approximately 8 months since moving from [place].  Hospital Course:  Patient was observed overnight.  Troponins unremarkable and EKG nonacute.  Seen by cardiology and underwent Lexi-scan which was interpreted as low risk.  Also had echocardiogram with normal LVEF.  Discussed with cardiology Ms. Barrett, patient cleared for discharge.  Cardiology office will arrange outpatient follow-up.  Patient is highly motivated to obtain his own PCP, has his own insurance and plans to pursue this tomorrow 12/30.  Chest pain, intermittent.  Risk factors essential hypertension, diabetes mellitus type 2 both currently untreated.  Smoker. --Troponins negative, EKG nonacute, d-dimer negative.  Nuclear stress test low risk and echocardiogram reassuring with normal LV systolic function.  Diabetes mellitus type 2, hemoglobin A1c 10.6 --CBG high but stable.  Patient previously on metformin and glipizide.  Will give him a new prescription  for these medications.  Essential hypertension with hypertensive urgency on admission --Resolved.  Start Cozaar as per cardiology.  Hyperlipidemia --Start statin on discharge  Noncompliance.  Has not taken any medication since moving here 8 months ago.  Needs PCP. --Patient highly motivated to obtain PCP, now has insurance, he will pursue this tomorrow.  Cigarette smoker --Recommend cessation  History of cocaine use, last 2 weeks ago --Discourage use  Consultants:  Cardiology  Procedures:  Echo Study Conclusions  - Left ventricle: The cavity size was normal. There was moderate   concentric hypertrophy. Systolic function was normal. The   estimated ejection fraction was in the range of 55% to 60%.   Possible mild hypokinesis of the basal-midinferior and   inferoseptal myocardium. - Left atrium: The atrium was mildly dilated.  Today's assessment: S: Feels well, no complaints presently.  Reports that when he has had chest pain in the past it is always been from high blood pressure.  He has been off medications for about 8 months since he moved from [place]. O: Vitals:  Vitals:   07/10/18 0836 07/10/18 1515  BP: 123/68 (!) 165/93  Pulse: 82 72  Resp:  15  Temp:  97.8 F (36.6 C)  SpO2:  100%    Constitutional:  . Appears calm and comfortable Respiratory:  . CTA bilaterally, no w/r/r.  . Respiratory effort normal. Cardiovascular:  . RRR, no m/r/g . No LE extremity edema   Psychiatric:  . judgement and insight appear normal . Mental status o Mood, affect appropriate  Data:  CBG high but stable  D-dimer negative, TSH within normal limits.  CBC and BMP unremarkable.  LFTs unremarkable.  D-dimer negative.  Urine drug screen negative.  EKG sinus rhythm, no acute changes.  Nuclear stress low risk  Discharge Instructions  Discharge Instructions    Activity as tolerated - No restrictions   Complete by:  As directed    Diet - low sodium heart  healthy   Complete by:  As directed    Diet Carb Modified   Complete by:  As directed    Discharge instructions   Complete by:  As directed    Call your physician or seek immediate medical attention for chest pain, shortness of breath or worsening of condition. Check blood sugars three times a day and keep a record to bring to your doctor. Call a physician or seek medical attention for blood sugar above 400 or less than 70. Arrange follow-up with a primary care doctor as soon as possible. The cardiology office will contact you with an appointment to see the heart doctor. Please stop smoking. Do not use cocaine or any other street drugs.     Allergies as of 07/10/2018   No Known Allergies     Medication List    STOP taking these medications   ibuprofen 200 MG tablet Commonly known as:  ADVIL,MOTRIN   lidocaine 5 % Commonly known as:  LIDODERM   methocarbamol 500 MG tablet Commonly known as:  ROBAXIN   naproxen 500 MG tablet Commonly known as:  NAPROSYN     TAKE these medications   atorvastatin 40 MG tablet Commonly known as:  LIPITOR Take 1 tablet (40 mg total) by mouth daily at 6 PM. Start taking on:  July 11, 2018   blood glucose meter kit and supplies Kit Dispense glucometer based on patient and insurance preference. Use up to four times daily as directed. (FOR ICD-9 250.00, 250.01).   glipiZIDE 5 MG tablet Commonly known as:  GLUCOTROL Take 1 tablet (5 mg total) by mouth 2 (two) times daily before a meal.   losartan 50 MG tablet Commonly known as:  COZAAR Take 1 tablet (50 mg total) by mouth daily. Start taking on:  July 11, 2018   metFORMIN 500 MG tablet Commonly known as:  GLUCOPHAGE Take 1 tablet (500 mg total) by mouth 2 (two) times daily with a meal. What changed:  how much to take      No Known Allergies  The results of significant diagnostics from this hospitalization (including imaging, microbiology, ancillary and laboratory) are listed below  for reference.    Significant Diagnostic Studies: Dg Chest 2 View  Result Date: 07/09/2018 CLINICAL DATA:  Chest pain onset today as well as throbbing sensation to right arm. H/o HTN and Angina per patient. Also c/o headache this a.m. EXAM: CHEST - 2 VIEW COMPARISON:  None. FINDINGS: The heart size and mediastinal contours are within normal limits. Both lungs are clear. The visualized skeletal structures are unremarkable. IMPRESSION: No active cardiopulmonary disease. Electronically Signed   By: Elizabeth  Brown M.D.   On: 07/09/2018 14:14   Nm Myocar Multi W/spect W/wall Motion / Ef  Result Date: 07/10/2018 CLINICAL DATA:  52-year-old presenting with acute chest pain. Current smoker. Current history of diabetes and hypertension. EXAM: MYOCARDIAL IMAGING WITH SPECT (REST AND PHARMACOLOGIC-STRESS) GATED LEFT VENTRICULAR WALL MOTION STUDY LEFT VENTRICULAR EJECTION FRACTION TECHNIQUE: Standard myocardial SPECT imaging was performed after resting intravenous injection of 10 mCi Tc-99m tetrofosmin. Subsequently, intravenous infusion of Lexiscan was performed under the supervision of the Cardiology staff. At peak effect of the drug, 30 mCi Tc-99m tetrofosmin was injected intravenously and standard myocardial SPECT imaging was performed. Quantitative gated imaging was also   performed to evaluate left ventricular wall motion, and estimate left ventricular ejection fraction. COMPARISON:  None. FINDINGS: Perfusion: Mild diminished activity involving a short segment of the distal INFERIOR wall on resting and post Lexiscan images. No evidence of reversibility. Wall Motion: Mild global hypokinesis without focal wall motion abnormality. Mild LEFT ventricular enlargement. Left Ventricular Ejection Fraction: 48% End diastolic volume 139 ml End systolic volume 72 ml IMPRESSION: 1. No evidence of myocardial ischemia. Attenuation versus myocardial infarct involving a short segment of the distal INFERIOR wall. 2. Mild global  hypokinesis.  No focal wall motion abnormality. 3. Left ventricular ejection fraction 48% 4.  Mild left ventricular enlargement. 5. Non invasive risk stratification*: Low. *2012 Appropriate Use Criteria for Coronary Revascularization Focused Update: J Am Coll Cardiol. 2012;59(9):857-881. http://content.onlinejacc.org/article.aspx?articleid=1201161. Electronically Signed   By: Thomas  Lawrence M.D.   On: 07/10/2018 14:25   Labs: Basic Metabolic Panel: Recent Labs  Lab 07/09/18 1312 07/10/18 0816  NA 133* 137  K 3.4* 3.6  CL 101 103  CO2 22 25  GLUCOSE 396* 244*  BUN 10 10  CREATININE 0.69 0.69  CALCIUM 8.7* 8.6*  MG  --  1.8   Liver Function Tests: Recent Labs  Lab 07/10/18 0816  AST 12*  ALT 17  ALKPHOS 70  BILITOT 0.6  PROT 6.3*  ALBUMIN 3.7   CBC: Recent Labs  Lab 07/09/18 1312 07/10/18 0816  WBC 9.7 9.4  NEUTROABS  --  5.3  HGB 16.8 16.3  HCT 48.3 48.0  MCV 84.0 86.8  PLT 418* 391    Recent Labs  Lab 07/09/18 1909 07/10/18 0116 07/10/18 0816  TROPONINI <0.03 <0.03 <0.03    CBG: Recent Labs  Lab 07/09/18 2207 07/10/18 0751 07/10/18 1621  GLUCAP 267* 189* 313*    Active Problems:   Chest pain   Diabetes mellitus type 2, noninsulin dependent (HCC)   Hypokalemia   Essential hypertension   Time coordinating discharge: 40 minutes  Signed:  Daniel Goodrich, MD [facility] 07/10/2018, 5:13 PM  

## 2018-07-10 NOTE — Progress Notes (Signed)
   Marcello MooresJose Saefong presented for a nuclear stress test today.  No immediate complications.  Stress imaging is pending at this time.  Preliminary EKG findings may be listed in the chart, but the stress test result will not be finalized until perfusion imaging is complete.  1 day study, [place] to read.  Theodore Demarkhonda Shelita Steptoe, PA-C 07/10/2018, 3:19 PM    Addendum: Myoview results are reviewed below.  Dr. Royann Shiversroitoru reviewed the results and feels that it is most likely diaphragmatic attenuation.  No ischemia, and the study is felt to be low risk.  Echocardiogram performed, results pending.  Follow-up on the echo results, but doubt any further cardiac work-up will be needed.   Nm Myocar Multi W/spect W/wall Motion / Ef  Result Date: 07/10/2018 CLINICAL DATA:  52 year old presenting with acute chest pain. Current smoker. Current history of diabetes and hypertension. EXAM: MYOCARDIAL IMAGING WITH SPECT (REST AND PHARMACOLOGIC-STRESS) GATED LEFT VENTRICULAR WALL MOTION STUDY LEFT VENTRICULAR EJECTION FRACTION TECHNIQUE: Standard myocardial SPECT imaging was performed after resting intravenous injection of 10 mCi Tc-5739m tetrofosmin. Subsequently, intravenous infusion of Lexiscan was performed under the supervision of the Cardiology staff. At peak effect of the drug, 30 mCi Tc-939m tetrofosmin was injected intravenously and standard myocardial SPECT imaging was performed. Quantitative gated imaging was also performed to evaluate left ventricular wall motion, and estimate left ventricular ejection fraction. COMPARISON:  None. FINDINGS: Perfusion: Mild diminished activity involving a short segment of the distal INFERIOR wall on resting and post Lexiscan images. No evidence of reversibility. Wall Motion: Mild global hypokinesis without focal wall motion abnormality. Mild LEFT ventricular enlargement. Left Ventricular Ejection Fraction: 48% End diastolic volume 139 ml End systolic volume 72 ml IMPRESSION: 1. No  evidence of myocardial ischemia. Attenuation versus myocardial infarct involving a short segment of the distal INFERIOR wall. 2. Mild global hypokinesis.  No focal wall motion abnormality. 3. Left ventricular ejection fraction 48% 4.  Mild left ventricular enlargement. 5. Non invasive risk stratification*: Low. *2012 Appropriate Use Criteria for Coronary Revascularization Focused Update: J Am Coll Cardiol. 2012;59(9):857-881. http://content.dementiazones.comonlinejacc.org/article.aspx?articleid=1201161. Electronically Signed   By: Hulan Saashomas  Lawrence M.D.   On: 07/10/2018 14:25   Theodore DemarkRhonda Nyheim Seufert, PA-C 07/10/2018 3:25 PM Beeper [phone]

## 2018-07-10 NOTE — CV Procedure (Signed)
2D echo attempted, but [facility] is at [facility] for stress test.Will try later

## 2018-07-10 NOTE — Progress Notes (Signed)
Reviewed discharge information with patient. Answered all questions. Pt able to teach back medications and reasons to contact MD/911. Patient verbalizes importance of PCP follow up appointment.  Gave number to health and wellness clinic. Scripts for glucometer and supplies. Work note provided.  Earnest ConroyBrooke M. Clelia CroftShaw, RN

## 2018-07-10 NOTE — Progress Notes (Addendum)
Progress Note  Patient Name: Dave MooresJose Moran Date of Encounter: 07/10/2018  Primary Cardiologist: New Subjective   No CP at present    Pain earlier was substernal   Radiated t oR arm    Inpatient Medications    Scheduled Meds: . aspirin  81 mg Oral Daily  . atorvastatin  40 mg Oral q1800  . enoxaparin (LOVENOX) injection  40 mg Subcutaneous Q24H  . insulin aspart  0-9 Units Subcutaneous TID WC   Continuous Infusions: . sodium chloride 75 mL/hr at 07/09/18 2136   PRN Meds: hydrALAZINE, morphine injection, nitroGLYCERIN   Vital Signs    Vitals:   07/09/18 1502 07/09/18 1557 07/09/18 1705 07/10/18 0546  BP: (!) 166/109  (!) 182/109 (!) 183/100  Pulse: 85  76 66  Resp: 18  16 18   Temp:    98 F (36.7 C)  TempSrc:    Oral  SpO2: 100%  98% 97%  Weight:  81.6 kg    Height:  5\' 7"  (1.702 m)      Intake/Output Summary (Last 24 hours) at 07/10/2018 0725 Last data filed at 07/10/2018 0654 Gross per 24 hour  Intake 695.57 ml  Output -  Net 695.57 ml   Filed Weights   07/09/18 1557  Weight: 81.6 kg    Telemetry    SR  - Personally Reviewed  ECG    Physical Exam   GEN: No acute distress.   Neck: No JVD Cardiac: RRR, no murmurs, rubs, or gallops.  Respiratory: Clear to auscultation bilaterally. GI: Soft, nontender, non-distended  MS: No edema; No deformity. Neuro:  Nonfocal  Psych: Normal affect   Labs    Chemistry Recent Labs  Lab 07/09/18 1312  NA 133*  K 3.4*  CL 101  CO2 22  GLUCOSE 396*  BUN 10  CREATININE 0.69  CALCIUM 8.7*  GFRNONAA >60  GFRAA >60  ANIONGAP 10     Hematology Recent Labs  Lab 07/09/18 1312  WBC 9.7  RBC 5.75  HGB 16.8  HCT 48.3  MCV 84.0  MCH 29.2  MCHC 34.8  RDW 12.1  PLT 418*    Cardiac Enzymes Recent Labs  Lab 07/09/18 1909 07/10/18 0116  TROPONINI <0.03 <0.03    Recent Labs  Lab 07/09/18 1316  TROPIPOC 0.01     BNPNo results for input(s): BNP, PROBNP in the last 168 hours.   DDimer    Recent Labs  Lab 07/09/18 1909  DDIMER 0.37     Radiology    Dg Chest 2 View  Result Date: 07/09/2018 CLINICAL DATA:  Chest pain onset today as well as throbbing sensation to right arm. H/o HTN and Angina per patient. Also c/o headache this a.m. EXAM: CHEST - 2 VIEW COMPARISON:  None. FINDINGS: The heart size and mediastinal contours are within normal limits. Both lungs are clear. The visualized skeletal structures are unremarkable. IMPRESSION: No active cardiopulmonary disease. Electronically Signed   By: Norva PavlovElizabeth  Brown M.D.   On: 07/09/2018 14:14    Cardiac Studies     Patient Profile     52 y.o. male hx of DM, HTN   Asked to see for CP    Assessment & Plan    1  CP  Pain somewhat atypcal Started Friday   Not with signif activity   Does have risk factors for CAD as noted (DM, HTn, Tob use, cocaine use)    Would reocmm ischemic eval   He has currently r/o for MI Plan with  HTN for Lexiscan myovue    2  HTN  BP was out of control   He has been out of BP meds for 8 months  (prior to this got at [facility] [place])   He has insurance   Stressed importance of BP control to prevent end organ damage   Would start Cozaar after myovue  3  Tob abuse  Counselled on cessation    4   Cocaine use   Says he used once in life 2 wks ago  5   HL   LDL 101   HDL 54   Trig 183 Rx with lipitor For questions or updates, please contact [facility] Please consult [email or web address] for contact info under        Signed, Dietrich PatesPaula Alessandro Griep, MD  07/10/2018, 7:25 AM

## 2018-07-10 NOTE — Progress Notes (Signed)
  Echocardiogram 2D Echocardiogram has been performed.  Dave Moran, [name] 07/10/2018, 3:13 PM

## 2018-07-10 NOTE — Progress Notes (Signed)
[facility] called to arrange transport to [facility] for Lexiscan.  Earnest ConroyBrooke M. Clelia CroftShaw, RN

## 2018-07-11 LAB — HIV ANTIBODY (ROUTINE TESTING W REFLEX): HIV Screen 4th Generation wRfx: NONREACTIVE

## 2018-07-23 ENCOUNTER — Emergency Department (HOSPITAL_COMMUNITY)
Admission: EM | Admit: 2018-07-23 | Discharge: 2018-07-23 | Disposition: A | Discharge status: Home / Self Care | Payer: 59 | Attending: Emergency Medicine | Admitting: Emergency Medicine

## 2018-07-23 ENCOUNTER — Emergency Department (HOSPITAL_COMMUNITY): Payer: 59

## 2018-07-23 ENCOUNTER — Encounter (HOSPITAL_COMMUNITY): Payer: Self-pay | Admitting: Emergency Medicine

## 2018-07-23 DIAGNOSIS — F17210 Nicotine dependence, cigarettes, uncomplicated: Secondary | ICD-10-CM | POA: Insufficient documentation

## 2018-07-23 DIAGNOSIS — E119 Type 2 diabetes mellitus without complications: Secondary | ICD-10-CM | POA: Diagnosis not present

## 2018-07-23 DIAGNOSIS — I10 Essential (primary) hypertension: Secondary | ICD-10-CM

## 2018-07-23 DIAGNOSIS — E876 Hypokalemia: Secondary | ICD-10-CM | POA: Insufficient documentation

## 2018-07-23 DIAGNOSIS — R109 Unspecified abdominal pain: Secondary | ICD-10-CM | POA: Diagnosis not present

## 2018-07-23 DIAGNOSIS — Z7984 Long term (current) use of oral hypoglycemic drugs: Secondary | ICD-10-CM | POA: Insufficient documentation

## 2018-07-23 LAB — COMPREHENSIVE METABOLIC PANEL
ALK PHOS: 71 U/L (ref 38–126)
ALT: 15 U/L (ref 0–44)
AST: 14 U/L — ABNORMAL LOW (ref 15–41)
Albumin: 4 g/dL (ref 3.5–5.0)
Anion gap: 10 (ref 5–15)
BUN: 12 mg/dL (ref 6–20)
CO2: 24 mmol/L (ref 22–32)
Calcium: 8.7 mg/dL — ABNORMAL LOW (ref 8.9–10.3)
Chloride: 102 mmol/L (ref 98–111)
Creatinine, Ser: 0.71 mg/dL (ref 0.61–1.24)
GFR calc Af Amer: >60 mL/min (ref >60)
GFR calc non Af Amer: >60 mL/min (ref >60)
Glucose, Bld: 151 mg/dL — ABNORMAL HIGH (ref 70–99)
Potassium: 3.2 mmol/L — ABNORMAL LOW (ref 3.5–5.1)
Sodium: 136 mmol/L (ref 135–145)
Total Bilirubin: 0.7 mg/dL (ref 0.3–1.2)
Total Protein: 6.6 g/dL (ref 6.5–8.1)

## 2018-07-23 LAB — LIPASE, BLOOD: Lipase: 39 U/L (ref 11–51)

## 2018-07-23 LAB — URINALYSIS, ROUTINE W REFLEX MICROSCOPIC
Bilirubin Urine: NEGATIVE
Glucose, UA: NEGATIVE mg/dL
HGB URINE DIPSTICK: NEGATIVE
Ketones, ur: 5 mg/dL — AB
Leukocytes, UA: NEGATIVE
Nitrite: NEGATIVE
Protein, ur: NEGATIVE mg/dL
Specific Gravity, Urine: 1.009 (ref 1.005–1.030)
pH: 6 (ref 5.0–8.0)

## 2018-07-23 LAB — CBC
HCT: 45.8 % (ref 39.0–52.0)
Hemoglobin: 15.9 g/dL (ref 13.0–17.0)
MCH: 29.7 pg (ref 26.0–34.0)
MCHC: 34.7 g/dL (ref 30.0–36.0)
MCV: 85.4 fL (ref 80.0–100.0)
Platelets: 355 10*3/uL (ref 150–400)
RBC: 5.36 MIL/uL (ref 4.22–5.81)
RDW: 12.2 % (ref 11.5–15.5)
WBC: 11.8 10*3/uL — ABNORMAL HIGH (ref 4.0–10.5)
nRBC: 0 % (ref 0.0–0.2)

## 2018-07-23 MED ORDER — ONDANSETRON HCL 4 MG/2ML IJ SOLN
4.0000 mg | Freq: Once | INTRAMUSCULAR | Status: AC
  Administered 2018-07-23: 4 mg via INTRAVENOUS
  Filled 2018-07-23: qty 2

## 2018-07-23 MED ORDER — IOPAMIDOL (ISOVUE-M 300) INJECTION 61%: 15.0000 mL | Freq: Once | INTRAMUSCULAR | Status: DC | PRN

## 2018-07-23 MED ORDER — SODIUM CHLORIDE (PF) 0.9 % IJ SOLN
INTRAMUSCULAR | Status: AC
  Filled 2018-07-23: qty 50

## 2018-07-23 MED ORDER — IOPAMIDOL (ISOVUE-300) INJECTION 61%
INTRAVENOUS | Status: AC
  Administered 2018-07-23: 100 mL
  Filled 2018-07-23: qty 100

## 2018-07-23 MED ORDER — MORPHINE SULFATE (PF) 4 MG/ML IV SOLN
4.0000 mg | Freq: Once | INTRAVENOUS | Status: AC
  Administered 2018-07-23: 4 mg via INTRAVENOUS
  Filled 2018-07-23: qty 1

## 2018-07-23 MED ORDER — ACETAMINOPHEN 500 MG PO TABS
1000.0000 mg | ORAL_TABLET | Freq: Once | ORAL | Status: AC
  Administered 2018-07-23: 1000 mg via ORAL
  Filled 2018-07-23: qty 2

## 2018-07-23 MED ORDER — SODIUM CHLORIDE 0.9 % IV BOLUS
1000.0000 mL | Freq: Once | INTRAVENOUS | Status: AC
  Administered 2018-07-23: 1000 mL via INTRAVENOUS

## 2018-07-23 MED ORDER — POTASSIUM CHLORIDE CRYS ER 20 MEQ PO TBCR
40.0000 meq | EXTENDED_RELEASE_TABLET | Freq: Once | ORAL | Status: AC
  Administered 2018-07-23: 40 meq via ORAL
  Filled 2018-07-23: qty 2

## 2018-07-23 NOTE — Discharge Instructions (Addendum)
It was our pleasure to provide your ER care today - we hope that you feel better.  From today's labs, your potassium level is mildly low (3.2) - eat plenty of fruits and vegetables, and follow up with primary care doctor in 1-2 weeks.   Your ct scan shows no acute process, but a moderate amount of stool is noted - take colace (stool softener), and miralax (laxative) as need.   Also, your blood pressure is high today - continue your blood pressure medication, limit salt intake, and follow up with primary care doctor in the coming week for recheck.   Return to ER if worse, new symptoms, high fevers, persistent vomiting, other concern.   You were given pain medication in the ER - no driving for the next 4 hours.

## 2018-07-23 NOTE — ED Triage Notes (Signed)
Pt c/o RLQ pains for several days that started off intermittent and now constant. Denies n/v/d or urinary problems. Reports pains feel similar to when had gallbladder taken out few years ago.

## 2018-07-23 NOTE — ED Notes (Signed)
Pt would like work note. ?

## 2018-07-23 NOTE — ED Provider Notes (Signed)
[facility] DEPT Provider Note   CSN: [id number] Arrival date & time: 07/23/18  0160     History   Chief Complaint Chief Complaint  Patient presents with  . Abdominal Pain    HPI Dave Moran is a 53 y.o. male.  Patient c/o right mid to upper abd pain in the past day. Symptoms initially gradual onset, mild, intermittent - pt now notes pain constant, dull, moderate, non radiating, worse w palpation. Had 1 loose bm yesterday. Nausea and decreased appetite today. No vomiting. No fever or chills. Denies cough or uri symptoms. No chest pain or sob. No back or flank pain. No dysuria, or hematuria. Remote hx cholecystectomy, no other abd surgery. ?remote hx kidney stone.   The history is provided by the patient.  Abdominal Pain  Associated symptoms: nausea   Associated symptoms: no chest pain, no cough, no dysuria, no fever, no hematuria, no shortness of breath and no sore throat     Past Medical History:  Diagnosis Date  . Diabetes mellitus without complication ([place])   . Hypertension     Patient Active Problem List   Diagnosis Date Noted  . Hypertensive urgency   . Chest pain 07/09/2018  . Diabetes mellitus type 2, noninsulin dependent ([place])   . Hypokalemia   . Essential hypertension     Past Surgical History:  Procedure Laterality Date  . CHOLECYSTECTOMY          Home Medications    Prior to Admission medications   Medication Sig Start Date End Date Taking? Authorizing Provider  atorvastatin (LIPITOR) 40 MG tablet Take 1 tablet (40 mg total) by mouth daily at 6 PM. 07/11/18   Samuella Cota, MD  blood glucose meter kit and supplies KIT Dispense glucometer based on patient and insurance preference. Use up to four times daily as directed. (FOR ICD-9 250.00, 250.01). 07/10/18   Samuella Cota, MD  glipiZIDE (GLUCOTROL) 5 MG tablet Take 1 tablet (5 mg total) by mouth 2 (two) times daily before a meal. 07/10/18 08/09/18  Samuella Cota, MD  losartan (COZAAR) 50 MG tablet Take 1 tablet (50 mg total) by mouth daily. 07/11/18   Samuella Cota, MD  metFORMIN (GLUCOPHAGE) 500 MG tablet Take 1 tablet (500 mg total) by mouth 2 (two) times daily with a meal. 07/10/18   Samuella Cota, MD    Family History No family history on file.  Social History Social History   Tobacco Use  . Smoking status: Current Every Day Smoker    Types: Cigarettes  . Smokeless tobacco: Never Used  Substance Use Topics  . Alcohol use: Yes  . Drug use: Never     Allergies   Patient has no known allergies.   Review of Systems Review of Systems  Constitutional: Positive for activity change. Negative for fever.  HENT: Negative for sore throat.   Eyes: Negative for redness.  Respiratory: Negative for cough and shortness of breath.   Cardiovascular: Negative for chest pain.  Gastrointestinal: Positive for abdominal pain and nausea.  Genitourinary: Negative for dysuria, flank pain, hematuria, scrotal swelling and testicular pain.  Musculoskeletal: Negative for back pain and neck pain.  Skin: Negative for rash.  Neurological: Negative for headaches.  Hematological: Does not bruise/bleed easily.  Psychiatric/Behavioral: Negative for confusion.     Physical Exam Updated Vital Signs BP (!) 164/102 (BP Location: Left Arm)   Pulse 83   Temp 98.3 F (36.8 C) (Oral)   Resp  20   SpO2 98%   Physical Exam Vitals signs and nursing note reviewed.  Constitutional:      Appearance: Normal appearance. He is well-developed.  HENT:     Head: Atraumatic.     Nose: Nose normal.     Mouth/Throat:     Mouth: Mucous membranes are moist.     Pharynx: Oropharynx is clear.  Eyes:     General: No scleral icterus.    Conjunctiva/sclera: Conjunctivae normal.     Pupils: Pupils are equal, round, and reactive to light.  Neck:     Musculoskeletal: Normal range of motion and neck supple. No neck rigidity.     Trachea: No tracheal  deviation.  Cardiovascular:     Rate and Rhythm: Normal rate and regular rhythm.     Pulses: Normal pulses.     Heart sounds: Normal heart sounds. No murmur. No friction rub. No gallop.   Pulmonary:     Effort: Pulmonary effort is normal. No accessory muscle usage or respiratory distress.     Breath sounds: Normal breath sounds.  Abdominal:     General: Bowel sounds are normal. There is no distension.     Palpations: Abdomen is soft. There is no mass.     Tenderness: There is abdominal tenderness. There is no guarding.     Hernia: No hernia is present.     Comments: Right mid abd tenderness.   Genitourinary:    Comments: No cva tenderness. Musculoskeletal:        General: No swelling.  Skin:    General: Skin is warm and dry.     Findings: No rash.  Neurological:     Mental Status: He is alert.     Comments: Alert, speech clear.   Psychiatric:        Mood and Affect: Mood normal.      ED Treatments / Results  Labs (all labs ordered are listed, but only abnormal results are displayed) Results for orders placed or performed during the hospital encounter of 07/23/18  Lipase, blood  Result Value Ref Range   Lipase 39 11 - 51 U/L  Comprehensive metabolic panel  Result Value Ref Range   Sodium 136 135 - 145 mmol/L   Potassium 3.2 (L) 3.5 - 5.1 mmol/L   Chloride 102 98 - 111 mmol/L   CO2 24 22 - 32 mmol/L   Glucose, Bld 151 (H) 70 - 99 mg/dL   BUN 12 6 - 20 mg/dL   Creatinine, Ser 0.71 0.61 - 1.24 mg/dL   Calcium 8.7 (L) 8.9 - 10.3 mg/dL   Total Protein 6.6 6.5 - 8.1 g/dL   Albumin 4.0 3.5 - 5.0 g/dL   AST 14 (L) 15 - 41 U/L   ALT 15 0 - 44 U/L   Alkaline Phosphatase 71 38 - 126 U/L   Total Bilirubin 0.7 0.3 - 1.2 mg/dL   GFR calc non Af Amer >60 >60 mL/min   GFR calc Af Amer >60 >60 mL/min   Anion gap 10 5 - 15  CBC  Result Value Ref Range   WBC 11.8 (H) 4.0 - 10.5 K/uL   RBC 5.36 4.22 - 5.81 MIL/uL   Hemoglobin 15.9 13.0 - 17.0 g/dL   HCT 45.8 39.0 - 52.0 %    MCV 85.4 80.0 - 100.0 fL   MCH 29.7 26.0 - 34.0 pg   MCHC 34.7 30.0 - 36.0 g/dL   RDW 12.2 11.5 - 15.5 %   Platelets 355  150 - 400 K/uL   nRBC 0.0 0.0 - 0.2 %  Urinalysis, Routine w reflex microscopic  Result Value Ref Range   Color, Urine YELLOW YELLOW   APPearance CLEAR CLEAR   Specific Gravity, Urine 1.009 1.005 - 1.030   pH 6.0 5.0 - 8.0   Glucose, UA NEGATIVE NEGATIVE mg/dL   Hgb urine dipstick NEGATIVE NEGATIVE   Bilirubin Urine NEGATIVE NEGATIVE   Ketones, ur 5 (A) NEGATIVE mg/dL   Protein, ur NEGATIVE NEGATIVE mg/dL   Nitrite NEGATIVE NEGATIVE   Leukocytes, UA NEGATIVE NEGATIVE   Dg Chest 2 View  Result Date: 07/09/2018 CLINICAL DATA:  Chest pain onset today as well as throbbing sensation to right arm. H/o HTN and Angina per patient. Also c/o headache this a.m. EXAM: CHEST - 2 VIEW COMPARISON:  None. FINDINGS: The heart size and mediastinal contours are within normal limits. Both lungs are clear. The visualized skeletal structures are unremarkable. IMPRESSION: No active cardiopulmonary disease. Electronically Signed   By: Nolon Nations M.D.   On: 07/09/2018 14:14   Ct Abdomen Pelvis W Contrast  Result Date: 07/23/2018 CLINICAL DATA:  Right lower quadrant pain for the past few days. EXAM: CT ABDOMEN AND PELVIS WITH CONTRAST TECHNIQUE: Multidetector CT imaging of the abdomen and pelvis was performed using the standard protocol following bolus administration of intravenous contrast. CONTRAST:  130m ISOVUE-300 IOPAMIDOL (ISOVUE-300) INJECTION 61% COMPARISON:  None. FINDINGS: Lower chest: No acute abnormality. Hepatobiliary: No focal liver abnormality is seen. Status post cholecystectomy. No biliary dilatation. Pancreas: Unremarkable. No pancreatic ductal dilatation or surrounding inflammatory changes. Spleen: Normal in size without focal abnormality. Adrenals/Urinary Tract: The adrenal glands are unremarkable. 1.4 cm simple cyst in the right kidney. No renal or ureteral calculi. No  hydronephrosis. Mild circumferential bladder wall thickening likely related to underdistention given normal urinalysis. Stomach/Bowel: Stomach is within normal limits. Appendix is not identified, but there are no signs of inflammation at the base of the cecum. No evidence of bowel wall thickening, distention, or inflammatory changes. Moderate amount of stool throughout the colon. Vascular/Lymphatic: No significant vascular findings are present. No enlarged abdominal or pelvic lymph nodes. Reproductive: Prostate is unremarkable. Other: No free fluid or pneumoperitoneum. Musculoskeletal: No acute or significant osseous findings. IMPRESSION: 1.  No acute intra-abdominal process. 2.  Prominent stool throughout the colon favors constipation. Electronically Signed   By: WTitus DubinM.D.   On: 07/23/2018 12:49   Nm Myocar Multi W/spect W/wall Motion / Ef  Result Date: 07/10/2018 CLINICAL DATA:  53year old presenting with acute chest pain. Current smoker. Current history of diabetes and hypertension. EXAM: MYOCARDIAL IMAGING WITH SPECT (REST AND PHARMACOLOGIC-STRESS) GATED LEFT VENTRICULAR WALL MOTION STUDY LEFT VENTRICULAR EJECTION FRACTION TECHNIQUE: Standard myocardial SPECT imaging was performed after resting intravenous injection of 10 mCi Tc-922metrofosmin. Subsequently, intravenous infusion of Lexiscan was performed under the supervision of the Cardiology staff. At peak effect of the drug, 30 mCi Tc-9963mtrofosmin was injected intravenously and standard myocardial SPECT imaging was performed. Quantitative gated imaging was also performed to evaluate left ventricular wall motion, and estimate left ventricular ejection fraction. COMPARISON:  None. FINDINGS: Perfusion: Mild diminished activity involving a short segment of the distal INFERIOR wall on resting and post Lexiscan images. No evidence of reversibility. Wall Motion: Mild global hypokinesis without focal wall motion abnormality. Mild LEFT ventricular  enlargement. Left Ventricular Ejection Fraction: 48%81%d diastolic volume [id number] End systolic volume 72 ml IMPRESSION: 1. No evidence of myocardial ischemia. Attenuation versus myocardial infarct involving  a short segment of the distal INFERIOR wall. 2. Mild global hypokinesis.  No focal wall motion abnormality. 3. Left ventricular ejection fraction 48% 4.  Mild left ventricular enlargement. 5. Non invasive risk stratification*: Low. *2012 Appropriate Use Criteria for Coronary Revascularization Focused Update: J Am Coll Cardiol. 1610;96(0):454-098. http://content.airportbarriers.com.aspx?articleid=1201161. Electronically Signed   By: Evangeline Dakin M.D.   On: 07/10/2018 14:25    EKG None  Radiology Ct Abdomen Pelvis W Contrast  Result Date: 07/23/2018 CLINICAL DATA:  Right lower quadrant pain for the past few days. EXAM: CT ABDOMEN AND PELVIS WITH CONTRAST TECHNIQUE: Multidetector CT imaging of the abdomen and pelvis was performed using the standard protocol following bolus administration of intravenous contrast. CONTRAST:  165m ISOVUE-300 IOPAMIDOL (ISOVUE-300) INJECTION 61% COMPARISON:  None. FINDINGS: Lower chest: No acute abnormality. Hepatobiliary: No focal liver abnormality is seen. Status post cholecystectomy. No biliary dilatation. Pancreas: Unremarkable. No pancreatic ductal dilatation or surrounding inflammatory changes. Spleen: Normal in size without focal abnormality. Adrenals/Urinary Tract: The adrenal glands are unremarkable. 1.4 cm simple cyst in the right kidney. No renal or ureteral calculi. No hydronephrosis. Mild circumferential bladder wall thickening likely related to underdistention given normal urinalysis. Stomach/Bowel: Stomach is within normal limits. Appendix is not identified, but there are no signs of inflammation at the base of the cecum. No evidence of bowel wall thickening, distention, or inflammatory changes. Moderate amount of stool throughout the colon.  Vascular/Lymphatic: No significant vascular findings are present. No enlarged abdominal or pelvic lymph nodes. Reproductive: Prostate is unremarkable. Other: No free fluid or pneumoperitoneum. Musculoskeletal: No acute or significant osseous findings. IMPRESSION: 1.  No acute intra-abdominal process. 2.  Prominent stool throughout the colon favors constipation. Electronically Signed   By: WTitus DubinM.D.   On: 07/23/2018 12:49    Procedures Procedures (including critical care time)  Medications Ordered in ED Medications  sodium chloride 0.9 % bolus 1,000 mL (has no administration in time range)  morphine 4 MG/ML injection 4 mg (has no administration in time range)  ondansetron (ZOFRAN) injection 4 mg (has no administration in time range)     Initial Impression / Assessment and Plan / ED Course  I have reviewed the triage vital signs and the nursing notes.  Pertinent labs & imaging results that were available during my care of the patient were reviewed by me and considered in my medical decision making (see chart for details).  Iv ns. Labs sent. Imaging ordered.   Morphine iv. zofran iv.  Reviewed nursing notes and prior charts for additional history.   Labs reviewed- chem w mildly low k - kcl po.  Ct reviewed - no acute process. Moderate stool noted.  Acetaminophen po. Po fluids.  No emesis in ED. Pt afebrile.   Pt indicates has adequate bp meds at home for bp, and will take then.     Final Clinical Impressions(s) / ED Diagnoses   Final diagnoses:  None    ED Discharge Orders    None       SLajean Saver MD 07/23/18 1330

## 2018-07-23 NOTE — ED Notes (Signed)
Patient transported to CT 

## 2018-08-09 ENCOUNTER — Ambulatory Visit (INDEPENDENT_AMBULATORY_CARE_PROVIDER_SITE_OTHER): Payer: 59

## 2018-08-09 ENCOUNTER — Ambulatory Visit (HOSPITAL_COMMUNITY)
Admission: EM | Admit: 2018-08-09 | Discharge: 2018-08-09 | Disposition: A | Discharge status: Home / Self Care | Payer: 59 | Attending: Emergency Medicine | Admitting: Emergency Medicine

## 2018-08-09 ENCOUNTER — Encounter (HOSPITAL_COMMUNITY): Payer: Self-pay

## 2018-08-09 DIAGNOSIS — R109 Unspecified abdominal pain: Secondary | ICD-10-CM | POA: Diagnosis not present

## 2018-08-09 DIAGNOSIS — R1031 Right lower quadrant pain: Secondary | ICD-10-CM

## 2018-08-09 DIAGNOSIS — K5900 Constipation, unspecified: Secondary | ICD-10-CM

## 2018-08-09 MED ORDER — MAGNESIUM CITRATE PO SOLN: 1.0000 | Freq: Once | ORAL | 1 refills | Status: AC

## 2018-08-09 NOTE — ED Provider Notes (Signed)
[facility]    CSN: [id number] Arrival date & time: 08/09/18  0923     History   Chief Complaint Chief Complaint  Patient presents with  . Abdominal Pain    HPI Dave Moran is a 53 y.o. male.   Pt c/o RT side abd pain since mon. States that he was seen in ER on 1/11 had ct scan and lab work was told he was constipated. States that he took the miralx and had several BM last one mon. States that no BM since then the pain is not getting any better. Is unsure what is the cause. Denies any n/v no fevers. Denies any dark stools. Denies any urinary sx.      Past Medical History:  Diagnosis Date  . Diabetes mellitus without complication ([place])   . Hypertension     Patient Active Problem List   Diagnosis Date Noted  . Hypertensive urgency   . Chest pain 07/09/2018  . Diabetes mellitus type 2, noninsulin dependent ([place])   . Hypokalemia   . Essential hypertension     Past Surgical History:  Procedure Laterality Date  . CHOLECYSTECTOMY         Home Medications    Prior to Admission medications   Medication Sig Start Date End Date Taking? Authorizing Provider  atorvastatin (LIPITOR) 40 MG tablet Take 1 tablet (40 mg total) by mouth daily at 6 PM. 07/11/18   Samuella Cota, MD  blood glucose meter kit and supplies KIT Dispense glucometer based on patient and insurance preference. Use up to four times daily as directed. (FOR ICD-9 250.00, 250.01). 07/10/18   Samuella Cota, MD  glipiZIDE (GLUCOTROL) 5 MG tablet Take 1 tablet (5 mg total) by mouth 2 (two) times daily before a meal. 07/10/18 08/09/18  Samuella Cota, MD  ibuprofen (ADVIL,MOTRIN) 200 MG tablet Take 400 mg by mouth every 6 (six) hours as needed for moderate pain.    [provider]  losartan (COZAAR) 50 MG tablet Take 1 tablet (50 mg total) by mouth daily. 07/11/18   Samuella Cota, MD  magnesium citrate SOLN Take 296 mLs (1 Bottle total) by mouth once for 1 dose. 08/09/18  08/09/18  Marney Setting, NP  metFORMIN (GLUCOPHAGE) 500 MG tablet Take 1 tablet (500 mg total) by mouth 2 (two) times daily with a meal. 07/10/18   Samuella Cota, MD    Family History History reviewed. No pertinent family history.  Social History Social History   Tobacco Use  . Smoking status: Current Every Day Smoker    Types: Cigarettes  . Smokeless tobacco: Never Used  Substance Use Topics  . Alcohol use: Yes  . Drug use: Never     Allergies   Patient has no known allergies.   Review of Systems Review of Systems  Constitutional: Negative.   Respiratory: Negative.   Gastrointestinal: Positive for abdominal pain.       RT side upper and lower   Genitourinary: Negative.   Musculoskeletal: Negative.   Skin: Negative.   Neurological: Negative.      Physical Exam Triage Vital Signs ED Triage Vitals  Enc Vitals Group     BP 08/09/18 1010 (!) 156/113     Pulse Rate 08/09/18 1010 87     Resp 08/09/18 1010 18     Temp 08/09/18 1010 97.8 F (36.6 C)     Temp Source 08/09/18 1010 Oral     SpO2 08/09/18 1010 100 %  Weight --      Height --      Head Circumference --      Peak Flow --      Pain Score 08/09/18 1011 7     Pain Loc --      Pain Edu? --      Excl. in [place]? --    No data found.  Updated Vital Signs BP (!) 156/113 (BP Location: Right Arm)   Pulse 87   Temp 97.8 F (36.6 C) (Oral)   Resp 18   SpO2 100%   Visual Acuity Right Eye Distance:   Left Eye Distance:   Bilateral Distance:    Right Eye Near:   Left Eye Near:    Bilateral Near:     Physical Exam Cardiovascular:     Rate and Rhythm: Normal rate.  Pulmonary:     Effort: Pulmonary effort is normal.  Abdominal:     General: Bowel sounds are absent. There is distension.     Palpations: Abdomen is soft.     Tenderness: There is abdominal tenderness in the right upper quadrant and right lower quadrant. There is guarding.  Genitourinary:    Comments: Denies  Skin:     General: Skin is warm.  Neurological:     General: No focal deficit present.     Mental Status: He is alert.      UC Treatments / Results  Labs (all labs ordered are listed, but only abnormal results are displayed) Labs Reviewed - No data to display  EKG None  Radiology Dg Abd 2 Views  Result Date: 08/09/2018 CLINICAL DATA:  Right-sided abdominal pain for 2 weeks EXAM: ABDOMEN - 2 VIEW COMPARISON:  07/23/2018 FINDINGS: Scattered large and small bowel gas is noted. Mild retained fecal material is noted predominately within the right colon. Changes of prior cholecystectomy are seen. No obstructive changes are noted. No acute bony abnormality is noted. IMPRESSION: Mild retained fecal material.  No other focal abnormality is noted Electronically Signed   By: Inez Catalina M.D.   On: 08/09/2018 11:09    Procedures Procedures (including critical care time)  Medications Ordered in UC Medications - No data to display  Initial Impression / Assessment and Plan / UC Course  I have reviewed the triage vital signs and the nursing notes.  Pertinent labs & imaging results that were available during my care of the patient were reviewed by me and considered in my medical decision making (see chart for details).   reviewed previous er chart  Will need to see pcp placed assistance for a new one  Educated on causes for constipation    Final Clinical Impressions(s) / UC Diagnoses   Final diagnoses:  Abdominal pain  Constipation, unspecified constipation type  Right lower quadrant abdominal pain     Discharge Instructions     Take magnesium citrate drink 1 bottle may take stool softners  May drink fiber to help and may need to drink daily to prevent      ED Prescriptions    Medication Sig Dispense Auth. Provider   magnesium citrate SOLN Take 296 mLs (1 Bottle total) by mouth once for 1 dose. 195 mL Marney Setting, NP     Controlled Substance Prescriptions [place] Controlled  Substance Registry consulted? Not Applicable   Marney Setting, NP 08/09/18 1124

## 2018-08-09 NOTE — ED Triage Notes (Signed)
Pt present right lower quadrant pain in his abdominal area.  Symptoms started two weeks ago.  Pt was recently seen at the emergency room for these symptoms.

## 2018-08-09 NOTE — Discharge Instructions (Signed)
Take magnesium citrate drink 1 bottle may take stool softners  May drink fiber to help and may need to drink daily to prevent

## 2018-09-29 ENCOUNTER — Encounter: Payer: Self-pay | Admitting: Internal Medicine

## 2018-10-05 ENCOUNTER — Telehealth: Payer: Self-pay | Admitting: Internal Medicine

## 2018-10-05 NOTE — Telephone Encounter (Signed)
LVM on machine Pt hospitalized in Dec 2019 Myovue normal   Will postpone appt to  Early summer if needed due to corona vaccine

## 2018-10-07 NOTE — Telephone Encounter (Signed)
Spoke with patient. Reports I am not having any issues, cut back on smoking a whole lot.  Ok with rescheduling upcoming appointment.   Adv to call if any symptoms would occur in the meantime.

## 2018-10-10 ENCOUNTER — Ambulatory Visit: Payer: Self-pay | Admitting: Internal Medicine

## 2018-10-17 ENCOUNTER — Encounter (HOSPITAL_COMMUNITY): Payer: Self-pay

## 2018-10-17 ENCOUNTER — Other Ambulatory Visit: Payer: Self-pay

## 2018-10-17 ENCOUNTER — Emergency Department (HOSPITAL_COMMUNITY)
Admission: EM | Admit: 2018-10-17 | Discharge: 2018-10-17 | Disposition: A | Discharge status: Home / Self Care | Payer: No Typology Code available for payment source | Attending: Emergency Medicine | Admitting: Emergency Medicine

## 2018-10-17 DIAGNOSIS — F17210 Nicotine dependence, cigarettes, uncomplicated: Secondary | ICD-10-CM | POA: Insufficient documentation

## 2018-10-17 DIAGNOSIS — Z9049 Acquired absence of other specified parts of digestive tract: Secondary | ICD-10-CM | POA: Insufficient documentation

## 2018-10-17 DIAGNOSIS — Z79899 Other long term (current) drug therapy: Secondary | ICD-10-CM | POA: Insufficient documentation

## 2018-10-17 DIAGNOSIS — E119 Type 2 diabetes mellitus without complications: Secondary | ICD-10-CM | POA: Insufficient documentation

## 2018-10-17 DIAGNOSIS — Z7984 Long term (current) use of oral hypoglycemic drugs: Secondary | ICD-10-CM | POA: Diagnosis not present

## 2018-10-17 DIAGNOSIS — K5900 Constipation, unspecified: Secondary | ICD-10-CM | POA: Diagnosis present

## 2018-10-17 DIAGNOSIS — K5909 Other constipation: Secondary | ICD-10-CM | POA: Insufficient documentation

## 2018-10-17 DIAGNOSIS — I10 Essential (primary) hypertension: Secondary | ICD-10-CM | POA: Insufficient documentation

## 2018-10-17 MED ORDER — SORBITOL 70 % SOLN
960.0000 mL | TOPICAL_OIL | Freq: Once | ORAL | Status: AC
  Administered 2018-10-17: 960 mL via RECTAL
  Filled 2018-10-17: qty 473

## 2018-10-17 MED ORDER — POLYETHYLENE GLYCOL 3350 17 G PO PACK: 17.0000 g | PACK | Freq: Two times a day (BID) | ORAL | 0 refills | Status: DC

## 2018-10-17 MED ORDER — LIDOCAINE HCL URETHRAL/MUCOSAL 2 % EX GEL
1.0000 "application " | Freq: Once | CUTANEOUS | Status: AC
  Administered 2018-10-17: 1 via TOPICAL
  Filled 2018-10-17: qty 5

## 2018-10-17 NOTE — Discharge Instructions (Addendum)
Take miralax twice daily for a week for constipation   Stay hydrated. Eat plenty of fiber   See your doctor  Return to ER if you have worse abdominal pain, constipation, fever, vomiting

## 2018-10-17 NOTE — ED Notes (Signed)
Bed: WA10 Expected date:  Expected time:  Means of arrival:  Comments: Triage 1   

## 2018-10-17 NOTE — ED Triage Notes (Signed)
C/O Constipation x 5days   Patients last BM 5 days ago.  Patient has tried 2 enemas and mag citrate with no relief.   Patient states "liquid comes out a little bit"  C/o mid to right abdominal pain. 8/10  Patient ambulatory in triage.   A/Ox4

## 2018-10-17 NOTE — ED Provider Notes (Signed)
[facility] DEPT Provider Note   CSN: [id number] Arrival date & time: 10/17/18  1618    History   Chief Complaint Chief Complaint  Patient presents with  . Constipation  . Abdominal Pain    HPI Dave Moran is a 53 y.o. male hx of DM, HTN, here with constipation.  Patient states that he is constipated for the last 4 days or so.  He states that he was unable to have a bowel movement for the last 4 days.  He has tried milk of magnesia as well as enema with no success.  He denies any fever or vomiting.  He was seen in the ER 4 months ago for similar symptoms and CT at that point showed constipation.     The history is provided by the patient.    Past Medical History:  Diagnosis Date  . Chest pain 07/09/2018  . Diabetes mellitus type 2, noninsulin dependent ([place])   . Essential hypertension   . Hypertensive urgency   . Hypokalemia     Patient Active Problem List   Diagnosis Date Noted  . Hypertensive urgency   . Chest pain 07/09/2018  . Diabetes mellitus type 2, noninsulin dependent ([place])   . Hypokalemia   . Essential hypertension     Past Surgical History:  Procedure Laterality Date  . CHOLECYSTECTOMY          Home Medications    Prior to Admission medications   Medication Sig Start Date End Date Taking? Authorizing Provider  atorvastatin (LIPITOR) 40 MG tablet Take 1 tablet (40 mg total) by mouth daily at 6 PM. 07/11/18   Samuella Cota, MD  blood glucose meter kit and supplies KIT Dispense glucometer based on patient and insurance preference. Use up to four times daily as directed. (FOR ICD-9 250.00, 250.01). 07/10/18   Samuella Cota, MD  glipiZIDE (GLUCOTROL) 5 MG tablet Take 1 tablet (5 mg total) by mouth 2 (two) times daily before a meal. 07/10/18 08/09/18  Samuella Cota, MD  ibuprofen (ADVIL,MOTRIN) 200 MG tablet Take 400 mg by mouth every 6 (six) hours as needed for moderate pain.    [provider]   losartan (COZAAR) 50 MG tablet Take 1 tablet (50 mg total) by mouth daily. 07/11/18   Samuella Cota, MD  metFORMIN (GLUCOPHAGE) 500 MG tablet Take 1 tablet (500 mg total) by mouth 2 (two) times daily with a meal. 07/10/18   Samuella Cota, MD    Family History History reviewed. No pertinent family history.  Social History Social History   Tobacco Use  . Smoking status: Current Every Day Smoker    Types: Cigarettes  . Smokeless tobacco: Never Used  Substance Use Topics  . Alcohol use: Yes  . Drug use: Never     Allergies   Patient has no known allergies.   Review of Systems Review of Systems  Gastrointestinal: Positive for abdominal pain and constipation.  All other systems reviewed and are negative.    Physical Exam Updated Vital Signs BP (!) 178/94 (BP Location: Left Arm)   Pulse 100   Temp 97.6 F (36.4 C) (Oral)   Resp 17   Ht '5\' 6"'  (1.676 m)   Wt 77.1 kg   SpO2 100%   BMI 27.44 kg/m   Physical Exam Vitals signs and nursing note reviewed.  Constitutional:      Comments: Uncomfortable   HENT:     Head: Normocephalic.  Eyes:  Extraocular Movements: Extraocular movements intact.  Cardiovascular:     Rate and Rhythm: Normal rate and regular rhythm.  Pulmonary:     Effort: Pulmonary effort is normal.     Breath sounds: Normal breath sounds.  Abdominal:     General: Abdomen is flat.     Palpations: Abdomen is soft.     Comments: Mild LLQ tenderness, no rebound   Genitourinary:    Comments: Rectal- stool impaction  Skin:    General: Skin is warm.     Capillary Refill: Capillary refill takes less than 2 seconds.  Neurological:     General: No focal deficit present.     Mental Status: He is alert and oriented to person, place, and time.  Psychiatric:        Mood and Affect: Mood normal.        Behavior: Behavior normal.      ED Treatments / Results  Labs (all labs ordered are listed, but only abnormal results are displayed) Labs  Reviewed - No data to display  EKG None  Radiology No results found.  Procedures Fecal disimpaction Date/Time: 10/17/2018 5:13 PM Performed by: Drenda Freeze, MD Authorized by: Drenda Freeze, MD  Consent: Verbal consent obtained. Risks and benefits: risks, benefits and alternatives were discussed Consent given by: patient Patient understanding: patient states understanding of the procedure being performed Patient consent: the patient's understanding of the procedure matches consent given Procedure consent: procedure consent matches procedure scheduled Relevant documents: relevant documents present and verified Test results: test results available and properly labeled Patient identity confirmed: verbally with patient and arm band Local anesthesia used: yes  Anesthesia: Local anesthesia used: yes Local Anesthetic: topical anesthetic  Sedation: Patient sedated: no  Patient tolerance: Patient tolerated the procedure well with no immediate complications    (including critical care time)    Medications Ordered in ED Medications  lidocaine (XYLOCAINE) 2 % jelly 1 application (has no administration in time range)     Initial Impression / Assessment and Plan / ED Course  I have reviewed the triage vital signs and the nursing notes.  Pertinent labs & imaging results that were available during my care of the patient were reviewed by me and considered in my medical decision making (see chart for details).       Dave Moran is a 53 y.o. male here with constipation. Hx of constipation. Patient has stool impaction on exam. I was able to perform digital disimpaction.   7:54 PM Patient unable to have a bowel movement after disimpaction. Given SMOG enema and had a large bowel movement. Abdomen nontender now. Will dc home with miralax for constipation    Final Clinical Impressions(s) / ED Diagnoses   Final diagnoses:  None    ED Discharge Orders    None        Drenda Freeze, MD 10/17/18 Karl Bales

## 2018-10-28 ENCOUNTER — Telehealth: Payer: Self-pay

## 2018-10-31 ENCOUNTER — Telehealth: Payer: Self-pay | Admitting: Physician Assistant

## 2018-10-31 NOTE — Telephone Encounter (Signed)
Unable to leave VM regarding rescheduling pt with Dr Tenny Craw

## 2018-10-31 NOTE — Telephone Encounter (Signed)
Virtual Visit Pre-Appointment Phone Call  Steps For Call:  1. Confirm consent - "In the setting of the current Covid19 crisis, you are scheduled for a (phone or video) visit with your provider on (date) at (time).  Just as we do with many in-office visits, in order for you to participate in this visit, we must obtain consent.  If you'd like, I can send this to your mychart (if signed up) or email for you to review.  Otherwise, I can obtain your verbal consent now.  All virtual visits are billed to your insurance company just like a normal visit would be.  By agreeing to a virtual visit, we'd like you to understand that the technology does not allow for your provider to perform an examination, and thus may limit your provider's ability to fully assess your condition. If your provider identifies any concerns that need to be evaluated in person, we will make arrangements to do so.  Finally, though the technology is pretty good, we cannot assure that it will always work on either your or our end, and in the setting of a video visit, we may have to convert it to a phone-only visit.  In either situation, we cannot ensure that we have a secure connection.  Are you willing to proceed?" STAFF: Did the patient verbally acknowledge consent to telehealth visit? Document YES/NO here: YES  2. Confirm the BEST phone number to call the day of the visit by including in appointment notes  3. Give patient instructions for WebEx/MyChart download to smartphone as below or Doximity/Doxy.me if video visit (depending on what platform provider is using)  4. Advise patient to be prepared with their blood pressure, heart rate, weight, any heart rhythm information, their current medicines, and a piece of paper and pen handy for any instructions they may receive the day of their visit  5. Inform patient they will receive a phone call 15 minutes prior to their appointment time (may be from unknown caller ID) so they should be  prepared to answer  6. Confirm that appointment type is correct in Epic appointment notes (VIDEO vs PHONE)     TELEPHONE CALL NOTE  Dave Moran has been deemed a candidate for a follow-up tele-health visit to limit community exposure during the Covid-19 pandemic. I spoke with the patient via phone to ensure availability of phone/video source, confirm preferred email & phone number, and discuss instructions and expectations.  I reminded Dave Moran to be prepared with any vital sign and/or heart rhythm information that could potentially be obtained via home monitoring, at the time of his visit. I reminded Dave Moran to expect a phone call at the time of his visit if his visit.  Elliot CousinWitty, Wallace Gappa, RMA 10/31/2018 10:31 AM   INSTRUCTIONS FOR DOWNLOADING THE WEBEX APP TO SMARTPHONE  - If Apple, ask patient to go to [employer] Store and type in WebEx in the search bar. Download [employer]Webex Meetings, the blue/green circle. If Android, go to [employer] Play Store and type in [employer] CompanyWebEx in the search bar. The app is free but as with any other app downloads, their phone may require them to verify saved payment information or Apple/Android password.  - The patient does NOT have to create an account. - On the day of the visit, the assist will walk the patient through joining the meeting with the meeting number/password.  INSTRUCTIONS FOR DOWNLOADING THE MYCHART APP TO SMARTPHONE  - The patient must first make sure to have  activated MyChart and know their login information - If Apple, go to [employer] and type in MyChart in the search bar and download the app. If Android, ask patient to go to [employer] and type in [place] in the search bar and download the app. The app is free but as with any other app downloads, their phone may require them to verify saved payment information or Apple/Android password.  - The patient will need to then log into the app with their MyChart username and password, and select  [facility] as their healthcare provider to link the account. When it is time for your visit, go to the MyChart app, find appointments, and click Begin Video Visit. Be sure to Select Allow for your device to access the Microphone and Camera for your visit. You will then be connected, and your provider will be with you shortly.  **If they have any issues connecting, or need assistance please contact MyChart service desk [phone])**  **If using a computer, in order to ensure the best quality for their visit they will need to use either of the following Internet Browsers: [employer], or Google Chrome**  IF USING DOXIMITY or DOXY.ME - The patient will receive a link just prior to their visit, either by text or email (to be determined day of appointment depending on if it's doxy.me or Doximity).     FULL LENGTH CONSENT FOR TELE-HEALTH VISIT   I hereby voluntarily request, consent and authorize [facility] and its employed or contracted physicians, physician assistants, nurse practitioners or other licensed health care professionals (the Practitioner), to provide me with telemedicine health care services (the "Services") as deemed necessary by the treating Practitioner. I acknowledge and consent to receive the Services by the Practitioner via telemedicine. I understand that the telemedicine visit will involve communicating with the Practitioner through live audiovisual communication technology and the disclosure of certain medical information by electronic transmission. I acknowledge that I have been given the opportunity to request an in-person assessment or other available alternative prior to the telemedicine visit and am voluntarily participating in the telemedicine visit.  I understand that I have the right to withhold or withdraw my consent to the use of telemedicine in the course of my care at any time, without affecting my right to future care or treatment, and that the  Practitioner or I may terminate the telemedicine visit at any time. I understand that I have the right to inspect all information obtained and/or recorded in the course of the telemedicine visit and may receive copies of available information for a reasonable fee.  I understand that some of the potential risks of receiving the Services via telemedicine include:  Marland Kitchen Delay or interruption in medical evaluation due to technological equipment failure or disruption; . Information transmitted may not be sufficient (e.g. poor resolution of images) to allow for appropriate medical decision making by the Practitioner; and/or  . In rare instances, security protocols could fail, causing a breach of personal health information.  Furthermore, I acknowledge that it is my responsibility to provide information about my medical history, conditions and care that is complete and accurate to the best of my ability. I acknowledge that Practitioner's advice, recommendations, and/or decision may be based on factors not within their control, such as incomplete or inaccurate data provided by me or distortions of diagnostic images or specimens that may result from electronic transmissions. I understand that the practice of medicine is not an exact science and that  Practitioner makes no warranties or guarantees regarding treatment outcomes. I acknowledge that I will receive a copy of this consent concurrently upon execution via email to the email address I last provided but may also request a printed copy by calling the office of [facility].    I understand that my insurance will be billed for this visit.   I have read or had this consent read to me. . I understand the contents of this consent, which adequately explains the benefits and risks of the Services being provided via telemedicine.  . I have been provided ample opportunity to ask questions regarding this consent and the Services and have had my questions answered to my  satisfaction. . I give my informed consent for the services to be provided through the use of telemedicine in my medical care  By participating in this telemedicine visit I agree to the above.

## 2018-10-31 NOTE — Telephone Encounter (Signed)
Spoke with patient who verified all demographics.  He will be signing up for My Chart this morning. Obtained verbal consent for virtual visit. Will have vitals for visit. Has smart phone.

## 2018-11-02 ENCOUNTER — Other Ambulatory Visit: Payer: Self-pay

## 2018-11-02 ENCOUNTER — Telehealth: Payer: Non-veteran care | Admitting: Physician Assistant

## 2018-11-23 ENCOUNTER — Emergency Department (HOSPITAL_COMMUNITY): Payer: No Typology Code available for payment source

## 2018-11-23 ENCOUNTER — Emergency Department (HOSPITAL_COMMUNITY)
Admission: EM | Admit: 2018-11-23 | Discharge: 2018-11-24 | Disposition: A | Discharge status: Other Acute Inpatient Hospital | Payer: No Typology Code available for payment source | Attending: Emergency Medicine | Admitting: Emergency Medicine

## 2018-11-23 ENCOUNTER — Encounter (HOSPITAL_COMMUNITY): Payer: Self-pay

## 2018-11-23 ENCOUNTER — Other Ambulatory Visit: Payer: Self-pay

## 2018-11-23 ENCOUNTER — Ambulatory Visit (HOSPITAL_COMMUNITY)
Admission: AD | Admit: 2018-11-23 | Discharge: 2018-11-23 | Disposition: A | Discharge status: Home / Self Care | Payer: Self-pay | Attending: Psychiatry | Admitting: Psychiatry

## 2018-11-23 DIAGNOSIS — Z9114 Patient's other noncompliance with medication regimen: Secondary | ICD-10-CM | POA: Insufficient documentation

## 2018-11-23 DIAGNOSIS — I10 Essential (primary) hypertension: Secondary | ICD-10-CM | POA: Insufficient documentation

## 2018-11-23 DIAGNOSIS — R45851 Suicidal ideations: Secondary | ICD-10-CM | POA: Insufficient documentation

## 2018-11-23 DIAGNOSIS — F322 Major depressive disorder, single episode, severe without psychotic features: Secondary | ICD-10-CM | POA: Insufficient documentation

## 2018-11-23 DIAGNOSIS — Z1159 Encounter for screening for other viral diseases: Secondary | ICD-10-CM | POA: Insufficient documentation

## 2018-11-23 DIAGNOSIS — F329 Major depressive disorder, single episode, unspecified: Secondary | ICD-10-CM | POA: Insufficient documentation

## 2018-11-23 DIAGNOSIS — R1031 Right lower quadrant pain: Secondary | ICD-10-CM

## 2018-11-23 DIAGNOSIS — K5900 Constipation, unspecified: Secondary | ICD-10-CM | POA: Diagnosis not present

## 2018-11-23 DIAGNOSIS — Z046 Encounter for general psychiatric examination, requested by authority: Secondary | ICD-10-CM | POA: Insufficient documentation

## 2018-11-23 DIAGNOSIS — R112 Nausea with vomiting, unspecified: Secondary | ICD-10-CM | POA: Insufficient documentation

## 2018-11-23 DIAGNOSIS — R630 Anorexia: Secondary | ICD-10-CM | POA: Diagnosis not present

## 2018-11-23 DIAGNOSIS — E119 Type 2 diabetes mellitus without complications: Secondary | ICD-10-CM | POA: Diagnosis not present

## 2018-11-23 DIAGNOSIS — Z7289 Other problems related to lifestyle: Secondary | ICD-10-CM | POA: Insufficient documentation

## 2018-11-23 DIAGNOSIS — F4310 Post-traumatic stress disorder, unspecified: Secondary | ICD-10-CM | POA: Diagnosis present

## 2018-11-23 DIAGNOSIS — F1194 Opioid use, unspecified with opioid-induced mood disorder: Secondary | ICD-10-CM | POA: Diagnosis not present

## 2018-11-23 DIAGNOSIS — R251 Tremor, unspecified: Secondary | ICD-10-CM | POA: Insufficient documentation

## 2018-11-23 DIAGNOSIS — F17210 Nicotine dependence, cigarettes, uncomplicated: Secondary | ICD-10-CM | POA: Insufficient documentation

## 2018-11-23 LAB — RAPID URINE DRUG SCREEN, HOSP PERFORMED
Amphetamines: NOT DETECTED
Barbiturates: NOT DETECTED
Benzodiazepines: NOT DETECTED
Cocaine: NOT DETECTED
Opiates: POSITIVE — AB
Tetrahydrocannabinol: NOT DETECTED

## 2018-11-23 LAB — COMPREHENSIVE METABOLIC PANEL
ALT: 18 U/L (ref 0–44)
AST: 13 U/L — ABNORMAL LOW (ref 15–41)
Albumin: 4.4 g/dL (ref 3.5–5.0)
Alkaline Phosphatase: 86 U/L (ref 38–126)
Anion gap: 12 (ref 5–15)
BUN: 9 mg/dL (ref 6–20)
CO2: 22 mmol/L (ref 22–32)
Calcium: 9.2 mg/dL (ref 8.9–10.3)
Chloride: 98 mmol/L (ref 98–111)
Creatinine, Ser: 0.8 mg/dL (ref 0.61–1.24)
GFR calc Af Amer: >60 mL/min (ref >60)
GFR calc non Af Amer: >60 mL/min (ref >60)
Glucose, Bld: 294 mg/dL — ABNORMAL HIGH (ref 70–99)
Potassium: 3.6 mmol/L (ref 3.5–5.1)
Sodium: 132 mmol/L — ABNORMAL LOW (ref 135–145)
Total Bilirubin: 0.8 mg/dL (ref 0.3–1.2)
Total Protein: 7.5 g/dL (ref 6.5–8.1)

## 2018-11-23 LAB — CBC
HCT: 48.3 % (ref 39.0–52.0)
Hemoglobin: 17.1 g/dL — ABNORMAL HIGH (ref 13.0–17.0)
MCH: 29.3 pg (ref 26.0–34.0)
MCHC: 35.4 g/dL (ref 30.0–36.0)
MCV: 82.7 fL (ref 80.0–100.0)
Platelets: 454 10*3/uL — ABNORMAL HIGH (ref 150–400)
RBC: 5.84 MIL/uL — ABNORMAL HIGH (ref 4.22–5.81)
RDW: 12.2 % (ref 11.5–15.5)
WBC: 11.9 10*3/uL — ABNORMAL HIGH (ref 4.0–10.5)
nRBC: 0 % (ref 0.0–0.2)

## 2018-11-23 LAB — ETHANOL: Alcohol, Ethyl (B): <10 mg/dL (ref <10)

## 2018-11-23 LAB — LIPASE, BLOOD: Lipase: 34 U/L (ref 11–51)

## 2018-11-23 LAB — ACETAMINOPHEN LEVEL: Acetaminophen (Tylenol), Serum: <10 ug/mL — ABNORMAL LOW (ref 10–30)

## 2018-11-23 LAB — SALICYLATE LEVEL: Salicylate Lvl: <7 mg/dL (ref 2.8–30.0)

## 2018-11-23 MED ORDER — KETOROLAC TROMETHAMINE 15 MG/ML IJ SOLN
15.0000 mg | Freq: Once | INTRAMUSCULAR | Status: AC
  Administered 2018-11-23: 23:00:00 15 mg via INTRAVENOUS
  Filled 2018-11-23: qty 1

## 2018-11-23 MED ORDER — IOHEXOL 300 MG/ML  SOLN
100.0000 mL | Freq: Once | INTRAMUSCULAR | Status: AC | PRN
  Administered 2018-11-23: 100 mL via INTRAVENOUS

## 2018-11-23 MED ORDER — SODIUM CHLORIDE 0.9 % IV BOLUS
1000.0000 mL | Freq: Once | INTRAVENOUS | Status: AC
  Administered 2018-11-23: 23:00:00 1000 mL via INTRAVENOUS

## 2018-11-23 MED ORDER — SODIUM CHLORIDE (PF) 0.9 % IJ SOLN
INTRAMUSCULAR | Status: AC
  Filled 2018-11-23: qty 50

## 2018-11-23 NOTE — BH Assessment (Signed)
[facility] Assessment Progress Note   Patient going to [facility] for medical clearance.  Clinician informed charge nurse Reita Cliche that patient was coming over.  He said patient can go to triage and be brought back.  AC Fransico Michael will review patient for possible admission pending medical clearance.

## 2018-11-23 NOTE — ED Notes (Signed)
Bed: WTR5 Expected date:  Expected time:  Means of arrival:  Comments: 

## 2018-11-23 NOTE — ED Triage Notes (Signed)
Pt was sent from [facility] for medical clearance and his blood pressure was elevated during their assessment. 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

## 2018-11-23 NOTE — BH Assessment (Addendum)
Assessment Note  Dave Moran is an 53 y.o. male.  -[facility].  They sent police to his residence to bring him to get help.  He arrived at [facility] unaccompanied.  Patient says he has SI with a plan to overdose on heroin.  He attempted the same about five years ago.  He reports deepening depression and an increase in anxiety.  Patient denies any HI or A/V hallucinations.  Patient has been clean for 5 years.  About 4 months ago he relapsed on heroin.  He has been snorting about a gram per day for the last few months.  Last use was on Monday (05/11).  He is now complaining of stomach cramps, nausea, vomiting, diarrhea, sweats.  He denies hx of seizures.  Patient denies regular use of ETOH or other drugs.  Patient also says he is supposed to be on Metformin and a BP med but has been on either for 4 months.  Patient lives with his girlfriend/fiance.  He says that if it had not been for her he would have gone ahead and overdosed on heroin.  He says that if he leaves he will try to kill himself.    Patient is tearful at times.  He is restless.  Patient wants to get help.  He said that he was at a [place] facility in [place] about 5 years ago.  He has no current outpatient care.  -Clinician discussed patient care with Donell Sievert, PA who recommends inpatient care.  Patient needs to be transferred to [facility] for med clearance.  Clinician attempted to call [facility] but there was no response.  Diagnosis: F32.2 MDD single episode severe; F11.20 Opioid use d/o severe; F43.10 PTSD  Past Medical History:  Past Medical History:  Diagnosis Date  . Chest pain 07/09/2018  . Diabetes mellitus type 2, noninsulin dependent (HCC)   . Essential hypertension   . Hypertensive urgency   . Hypokalemia     Past Surgical History:  Procedure Laterality Date  . CHOLECYSTECTOMY      Family History: No family history on file.  Social History:  reports that he has been smoking cigarettes. He has never used  smokeless tobacco. He reports current alcohol use. He reports that he does not use drugs.  Additional Social History:  Alcohol / Drug Use Pain Medications: None.  Has been using heroin. Prescriptions: Supposed to be on Metformin and a BP med but has not taken in over 4 months. Over the Counter: Ibuprophen when needed. History of alcohol / drug use?: Yes Longest period of sobriety (when/how long): Had been clean for five years until he relapsed about 4 months ago. Withdrawal Symptoms: Fever / Chills, Sweats, Weakness, Patient aware of relationship between substance abuse and physical/medical complications, Diarrhea, Nausea / Vomiting, Tremors Substance #1 Name of Substance 1: Heroin (snorting) 1 - Age of First Use: 53 years of age 59 - Amount (size/oz): About a gram or more a day 1 - Frequency: Daily  1 - Duration: 4 months ago relapsed 1 - Last Use / Amount: 11/21/18  CIWA: CIWA-Ar BP: (!) 174/118 Pulse Rate: 100 COWS:    Allergies: No Known Allergies  Home Medications: (Not in a hospital admission)   OB/GYN Status:  No LMP for male patient.  General Assessment Data Location of Assessment: [facility] TTS Assessment: In system Is this a Tele or Face-to-Face Assessment?: Face-to-Face Is this an Initial Assessment or a Re-assessment for this encounter?: Initial Assessment Patient Accompanied  by:: N/A Language Other than English: No Living Arrangements: Other (Comment)(Lives with girfriend) What gender do you identify as?: Male Marital status: Long term relationship Pregnancy Status: No Living Arrangements: Spouse/significant other Can pt return to current living arrangement?: Yes Admission Status: Voluntary Is patient capable of signing voluntary admission?: Yes Referral Source: Self/Family/Friend Insurance type: [facility]  Medical Screening Exam [facility] Walk-in ONLY) Medical Exam completed: Teacher, early years/pre, PA)  Crisis Care Plan Living Arrangements:  Spouse/significant other Name of Psychiatrist: None Name of Therapist: None  Education Status Is patient currently in school?: No Is the patient employed, unemployed or receiving disability?: Unemployed  Risk to self with the past 6 months Suicidal Ideation: Yes-Currently Present Has patient been a risk to self within the past 6 months prior to admission? : Yes Suicidal Intent: Yes-Currently Present Has patient had any suicidal intent within the past 6 months prior to admission? : Yes Is patient at risk for suicide?: Yes Suicidal Plan?: Yes-Currently Present Has patient had any suicidal plan within the past 6 months prior to admission? : Yes Specify Current Suicidal Plan: OD on heroin Access to Means: Yes Specify Access to Suicidal Means: Street heroin What has been your use of drugs/alcohol within the last 12 months?: Heroin Previous Attempts/Gestures: Yes How many times?: 1 Other Self Harm Risks: None Triggers for Past Attempts: Other personal contacts Intentional Self Injurious Behavior: None Family Suicide History: No Recent stressful life event(s): Trauma (Comment)(PTSD getting worse) Persecutory voices/beliefs?: No Depression: Yes Depression Symptoms: Despondent, Guilt, Loss of interest in usual pleasures, Feeling worthless/self pity, Insomnia, Tearfulness, Isolating Substance abuse history and/or treatment for substance abuse?: Yes Suicide prevention information given to non-admitted patients: Not applicable  Risk to Others within the past 6 months Homicidal Ideation: No Does patient have any lifetime risk of violence toward others beyond the six months prior to admission? : No Thoughts of Harm to Others: No Current Homicidal Intent: No Current Homicidal Plan: No Access to Homicidal Means: No Identified Victim: No one History of harm to others?: No Assessment of Violence: None Noted Violent Behavior Description: None reported Does patient have access to weapons?:  No Criminal Charges Pending?: No Does patient have a court date: No Is patient on probation?: No  Psychosis Hallucinations: None noted Delusions: None noted  Mental Status Report Appearance/Hygiene: Unremarkable Eye Contact: Fair Motor Activity: Freedom of movement, Restlessness Speech: Logical/coherent Level of Consciousness: Alert, Restless Mood: Depressed, Anxious, Helpless Affect: Anxious, Sad Anxiety Level: Moderate Thought Processes: Coherent, Relevant Judgement: Impaired Orientation: Person, Place, Situation, Time Obsessive Compulsive Thoughts/Behaviors: None  Cognitive Functioning Concentration: Decreased Memory: Recent Impaired, Remote Intact Is patient IDD: No Insight: Good Impulse Control: Poor Appetite: Poor Have you had any weight changes? : Loss Amount of the weight change? (lbs): (Pt unsure) Sleep: Decreased Total Hours of Sleep: (<4H/D) Vegetative Symptoms: Staying in bed, Decreased grooming  ADLScreening [facility] Assessment Services) Patient's cognitive ability adequate to safely complete daily activities?: Yes Patient able to express need for assistance with ADLs?: Yes Independently performs ADLs?: Yes (appropriate for developmental age)  Prior Inpatient Therapy Prior Inpatient Therapy: Yes Prior Therapy Dates: 5-6 years ago Prior [facility] [place] Reason for Treatment: SA and homelessness  Prior Outpatient Therapy Prior Outpatient Therapy: No Does patient have an ACCT team?: No Does patient have Intensive In-House Services?  : No Does patient have [facility] services? : No Does patient have P4CC services?: No  ADL Screening (condition at time of admission) Patient's cognitive ability adequate to safely complete  daily activities?: Yes Is the patient deaf or have difficulty hearing?: No(Tinnitis) Does the patient have difficulty seeing, even when wearing glasses/contacts?: No(Does use glasses.) Does the patient have  difficulty concentrating, remembering, or making decisions?: Yes Patient able to express need for assistance with ADLs?: Yes Does the patient have difficulty dressing or bathing?: No Independently performs ADLs?: Yes (appropriate for developmental age) Does the patient have difficulty walking or climbing stairs?: No Weakness of Legs: None Weakness of Arms/Hands: None  Home Assistive Devices/Equipment Home Assistive Devices/Equipment: None    Abuse/Neglect Assessment (Assessment to be complete while patient is alone) Abuse/Neglect Assessment Can Be Completed: Yes Physical Abuse: Denies Verbal Abuse: Denies Sexual Abuse: Denies Exploitation of patient/patient's resources: Denies Self-Neglect: Denies     Merchant navy officerAdvance Directives (For Healthcare) Does Patient Have a Medical Advance Directive?: No Would patient like information on creating a medical advance directive?: No - Patient declined          Disposition:  Disposition Initial Assessment Completed for this Encounter: Yes [facility] of [facility] Patient refused recommended treatment: No Mode of transportation if patient is discharged/movement?: [place] Patient referred to: Other (Comment)([facility]  On Site Evaluation by:   Reviewed with Physician:    Alexandria LodgeHarvey, Lakeyn Dokken Ray 11/23/2018 8:47 PM

## 2018-11-23 NOTE — ED Provider Notes (Signed)
[facility] Emergency Department Provider Note MRN:  [id number]  Arrival date & time: 11/24/18     Chief Complaint   Abdominal pain History of Present Illness   Dave Moran is a 53 y.o. year-old male with a history of hypertension, diabetes, heroin abuse presenting to the ED with chief complaint of abdominal pain.  2 days of persistent right upper quadrant abdominal pain, described as dull, 7 out of 10 in severity, constant.  Denies fever, endorsing decreased appetite, nausea, 2 episodes of nonbloody nonbilious emesis yesterday.  Denies chest pain or shortness of breath.  Patient explains that he is being cared for at behavioral health related to a recent relapse with heroin.  Denies ever using IV drugs, snorts the heroin.  Review of Systems  A complete 10 system review of systems was obtained and all systems are negative except as noted in the HPI and PMH.   Patient's Health History    Past Medical History:  Diagnosis Date  . Chest pain 07/09/2018  . Diabetes mellitus type 2, noninsulin dependent (HCC)   . Essential hypertension   . Hypertensive urgency   . Hypokalemia     Past Surgical History:  Procedure Laterality Date  . CHOLECYSTECTOMY      History reviewed. No pertinent family history.  Social History   Socioeconomic History  . Marital status: Significant Other    Spouse name: Not on file  . Number of children: Not on file  . Years of education: Not on file  . Highest education level: Not on file  Occupational History  . Not on file  Social Needs  . Financial resource strain: Not on file  . Food insecurity:    Worry: Not on file    Inability: Not on file  . Transportation needs:    Medical: Not on file    Non-medical: Not on file  Tobacco Use  . Smoking status: Current Every Day Smoker    Types: Cigarettes  . Smokeless tobacco: Never Used  Substance and Sexual Activity  . Alcohol use: Yes  . Drug use: Never  . Sexual activity: Not  on file  Lifestyle  . Physical activity:    Days per week: Not on file    Minutes per session: Not on file  . Stress: Not on file  Relationships  . Social connections:    Talks on phone: Not on file    Gets together: Not on file    Attends religious service: Not on file    Active member of club or organization: Not on file    Attends meetings of clubs or organizations: Not on file    Relationship status: Not on file  . Intimate partner violence:    Fear of current or ex partner: Not on file    Emotionally abused: Not on file    Physically abused: Not on file    Forced sexual activity: Not on file  Other Topics Concern  . Not on file  Social History Narrative  . Not on file     Physical Exam  Vital Signs and Nursing Notes reviewed Vitals:   11/23/18 2136  BP: (!) 169/113  Pulse: 78  Resp: 16  Temp: 97.7 F (36.5 C)  SpO2: 100%    CONSTITUTIONAL: Well-appearing, NAD NEURO:  Alert and oriented x 3, no focal deficits EYES:  eyes equal and reactive ENT/NECK:  no LAD, no JVD CARDIO: Regular rate, well-perfused, normal S1 and S2 PULM:  CTAB no wheezing  or rhonchi GI/GU:  normal bowel sounds, non-distended, moderate right lower quadrant tenderness to palpation MSK/SPINE:  No gross deformities, no edema SKIN:  no rash, atraumatic PSYCH:  Appropriate speech and behavior  Diagnostic and Interventional Summary    Labs Reviewed  COMPREHENSIVE METABOLIC PANEL - Abnormal; Notable for the following components:      Result Value   Sodium 132 (*)    Glucose, Bld 294 (*)    AST 13 (*)    All other components within normal limits  ACETAMINOPHEN LEVEL - Abnormal; Notable for the following components:   Acetaminophen (Tylenol), Serum <10 (*)    All other components within normal limits  CBC - Abnormal; Notable for the following components:   WBC 11.9 (*)    RBC 5.84 (*)    Hemoglobin 17.1 (*)    Platelets 454 (*)    All other components within normal limits  RAPID URINE  DRUG SCREEN, HOSP PERFORMED - Abnormal; Notable for the following components:   Opiates POSITIVE (*)    All other components within normal limits  ETHANOL  SALICYLATE LEVEL  LIPASE, BLOOD    CT ABDOMEN PELVIS W CONTRAST    (Results Pending)    Medications  sodium chloride (PF) 0.9 % injection (has no administration in time range)  sodium chloride 0.9 % bolus 1,000 mL (1,000 mLs Intravenous New Bag/Given 11/23/18 2256)  ketorolac (TORADOL) 15 MG/ML injection 15 mg (15 mg Intravenous Given 11/23/18 2254)  iohexol (OMNIPAQUE) 300 MG/ML solution 100 mL (100 mLs Intravenous Contrast Given 11/23/18 2356)     Procedures Critical Care  ED Course and Medical Decision Making  I have reviewed the triage vital signs and the nursing notes.  Pertinent labs & imaging results that were available during my care of the patient were reviewed by me and considered in my medical decision making (see below for details).  Question appendicitis in this 53 year old male with persistent right lower quadrant pain and tenderness to palpation.  Cannot exclude without CT abdomen.  Work-up pending.  Awaiting CT results, if unremarkable would be appropriate for discharge.  Signed out to oncoming provider at shift change.  Elmer SowMichael M. Pilar PlateBero, MD [facility] [email or web address]  Final Clinical Impressions(s) / ED Diagnoses     ICD-10-CM   1. Right lower quadrant abdominal pain R10.31     ED Discharge Orders    None         Sabas SousBero, Michael M, MD 11/24/18 [phone]

## 2018-11-24 ENCOUNTER — Other Ambulatory Visit: Payer: Self-pay

## 2018-11-24 ENCOUNTER — Encounter (HOSPITAL_COMMUNITY): Payer: Self-pay

## 2018-11-24 ENCOUNTER — Inpatient Hospital Stay (HOSPITAL_COMMUNITY)
Admission: AD | Admit: 2018-11-24 | Discharge: 2018-11-29 | DRG: 885 | Disposition: A | Discharge status: Home / Self Care | Payer: No Typology Code available for payment source | Source: Intra-hospital | Attending: Psychiatry | Admitting: Psychiatry

## 2018-11-24 DIAGNOSIS — I10 Essential (primary) hypertension: Secondary | ICD-10-CM | POA: Diagnosis present

## 2018-11-24 DIAGNOSIS — K219 Gastro-esophageal reflux disease without esophagitis: Secondary | ICD-10-CM | POA: Diagnosis present

## 2018-11-24 DIAGNOSIS — K589 Irritable bowel syndrome without diarrhea: Secondary | ICD-10-CM | POA: Diagnosis present

## 2018-11-24 DIAGNOSIS — R45851 Suicidal ideations: Secondary | ICD-10-CM

## 2018-11-24 DIAGNOSIS — G4700 Insomnia, unspecified: Secondary | ICD-10-CM | POA: Diagnosis present

## 2018-11-24 DIAGNOSIS — Z915 Personal history of self-harm: Secondary | ICD-10-CM | POA: Diagnosis not present

## 2018-11-24 DIAGNOSIS — E119 Type 2 diabetes mellitus without complications: Secondary | ICD-10-CM | POA: Diagnosis present

## 2018-11-24 DIAGNOSIS — F1194 Opioid use, unspecified with opioid-induced mood disorder: Secondary | ICD-10-CM

## 2018-11-24 DIAGNOSIS — F332 Major depressive disorder, recurrent severe without psychotic features: Secondary | ICD-10-CM | POA: Diagnosis not present

## 2018-11-24 DIAGNOSIS — T402X5A Adverse effect of other opioids, initial encounter: Secondary | ICD-10-CM | POA: Diagnosis present

## 2018-11-24 DIAGNOSIS — F4310 Post-traumatic stress disorder, unspecified: Secondary | ICD-10-CM | POA: Diagnosis present

## 2018-11-24 DIAGNOSIS — F17210 Nicotine dependence, cigarettes, uncomplicated: Secondary | ICD-10-CM | POA: Diagnosis present

## 2018-11-24 DIAGNOSIS — F1120 Opioid dependence, uncomplicated: Secondary | ICD-10-CM | POA: Diagnosis present

## 2018-11-24 DIAGNOSIS — R1031 Right lower quadrant pain: Secondary | ICD-10-CM | POA: Diagnosis not present

## 2018-11-24 DIAGNOSIS — K5900 Constipation, unspecified: Secondary | ICD-10-CM | POA: Clinically undetermined

## 2018-11-24 LAB — CBG MONITORING, ED
Glucose-Capillary: 416 mg/dL — ABNORMAL HIGH (ref 70–99)
Glucose-Capillary: 181 mg/dL — ABNORMAL HIGH (ref 70–99)

## 2018-11-24 LAB — SARS CORONAVIRUS 2 BY RT PCR (HOSPITAL ORDER, PERFORMED IN ~~LOC~~ HOSPITAL LAB): SARS Coronavirus 2: NEGATIVE

## 2018-11-24 MED ORDER — ACETAMINOPHEN 500 MG PO TABS
1000.0000 mg | ORAL_TABLET | Freq: Four times a day (QID) | ORAL | Status: DC | PRN
  Administered 2018-11-24 (×2): 1000 mg via ORAL
  Filled 2018-11-24 (×2): qty 2

## 2018-11-24 MED ORDER — LOSARTAN POTASSIUM 25 MG PO TABS
25.0000 mg | ORAL_TABLET | Freq: Once | ORAL | Status: AC
  Administered 2018-11-24: 17:00:00 25 mg via ORAL
  Filled 2018-11-24: qty 1

## 2018-11-24 MED ORDER — ATORVASTATIN CALCIUM 40 MG PO TABS
40.0000 mg | ORAL_TABLET | Freq: Every day | ORAL | Status: DC
  Filled 2018-11-24: qty 1

## 2018-11-24 MED ORDER — IBUPROFEN 400 MG PO TABS
400.0000 mg | ORAL_TABLET | Freq: Three times a day (TID) | ORAL | Status: DC | PRN
  Administered 2018-11-24: 20:00:00 400 mg via ORAL
  Filled 2018-11-24: qty 1

## 2018-11-24 MED ORDER — LOSARTAN POTASSIUM 50 MG PO TABS: 75.0000 mg | ORAL_TABLET | Freq: Every day | ORAL | Status: DC

## 2018-11-24 MED ORDER — NICOTINE 21 MG/24HR TD PT24
21.0000 mg | MEDICATED_PATCH | Freq: Every day | TRANSDERMAL | Status: DC
  Filled 2018-11-24 (×2): qty 1

## 2018-11-24 MED ORDER — LOSARTAN POTASSIUM 50 MG PO TABS
50.0000 mg | ORAL_TABLET | Freq: Every day | ORAL | Status: DC
  Administered 2018-11-24: 10:00:00 50 mg via ORAL
  Filled 2018-11-24 (×2): qty 1

## 2018-11-24 MED ORDER — HYDROXYZINE HCL 25 MG PO TABS
25.0000 mg | ORAL_TABLET | Freq: Four times a day (QID) | ORAL | Status: DC | PRN
  Administered 2018-11-25 – 2018-11-28 (×7): 25 mg via ORAL
  Filled 2018-11-24 (×7): qty 1

## 2018-11-24 MED ORDER — GLIPIZIDE 5 MG PO TABS
5.0000 mg | ORAL_TABLET | Freq: Every day | ORAL | Status: DC
  Administered 2018-11-25 – 2018-11-29 (×5): 5 mg via ORAL
  Filled 2018-11-24 (×9): qty 1

## 2018-11-24 MED ORDER — MAGNESIUM HYDROXIDE 400 MG/5ML PO SUSP
30.0000 mL | Freq: Every day | ORAL | Status: DC | PRN
  Administered 2018-11-27: 08:00:00 30 mL via ORAL
  Filled 2018-11-24: qty 30

## 2018-11-24 MED ORDER — POLYETHYLENE GLYCOL 3350 17 GM/SCOOP PO POWD
1.0000 | Freq: Once | ORAL | Status: AC
  Administered 2018-11-24: 09:00:00 255 g via ORAL
  Filled 2018-11-24: qty 255

## 2018-11-24 MED ORDER — METFORMIN HCL 500 MG PO TABS
500.0000 mg | ORAL_TABLET | Freq: Two times a day (BID) | ORAL | Status: DC
  Administered 2018-11-25 – 2018-11-29 (×9): 500 mg via ORAL
  Filled 2018-11-24 (×14): qty 1

## 2018-11-24 MED ORDER — CLONIDINE HCL 0.1 MG PO TABS
0.1000 mg | ORAL_TABLET | Freq: Once | ORAL | Status: AC
  Administered 2018-11-24: 17:00:00 0.1 mg via ORAL
  Filled 2018-11-24: qty 1

## 2018-11-24 MED ORDER — IBUPROFEN 200 MG PO TABS
600.0000 mg | ORAL_TABLET | Freq: Once | ORAL | Status: AC
  Administered 2018-11-24: 03:00:00 600 mg via ORAL
  Filled 2018-11-24: qty 3

## 2018-11-24 MED ORDER — TRAZODONE HCL 50 MG PO TABS
50.0000 mg | ORAL_TABLET | Freq: Every evening | ORAL | Status: DC | PRN
  Administered 2018-11-24 – 2018-11-25 (×2): 50 mg via ORAL
  Filled 2018-11-24 (×2): qty 1

## 2018-11-24 MED ORDER — CLONIDINE HCL 0.1 MG PO TABS
0.1000 mg | ORAL_TABLET | Freq: Every day | ORAL | Status: DC
  Filled 2018-11-24 (×2): qty 1

## 2018-11-24 MED ORDER — DICYCLOMINE HCL 20 MG PO TABS
20.0000 mg | ORAL_TABLET | Freq: Four times a day (QID) | ORAL | Status: DC | PRN
  Administered 2018-11-24 – 2018-11-28 (×10): 20 mg via ORAL
  Filled 2018-11-24 (×10): qty 1

## 2018-11-24 MED ORDER — ALUM & MAG HYDROXIDE-SIMETH 200-200-20 MG/5ML PO SUSP
30.0000 mL | ORAL | Status: DC | PRN
  Administered 2018-11-28: 21:00:00 30 mL via ORAL
  Filled 2018-11-24: qty 30

## 2018-11-24 MED ORDER — IBUPROFEN 200 MG PO TABS: 400.0000 mg | ORAL_TABLET | Freq: Three times a day (TID) | ORAL | Status: DC | PRN

## 2018-11-24 MED ORDER — HYDROXYZINE HCL 25 MG PO TABS
25.0000 mg | ORAL_TABLET | Freq: Three times a day (TID) | ORAL | Status: DC | PRN
  Administered 2018-11-24: 18:00:00 25 mg via ORAL
  Filled 2018-11-24: qty 1

## 2018-11-24 MED ORDER — NAPROXEN 500 MG PO TABS
500.0000 mg | ORAL_TABLET | Freq: Two times a day (BID) | ORAL | Status: DC | PRN
  Administered 2018-11-25 – 2018-11-26 (×2): 500 mg via ORAL
  Filled 2018-11-24 (×2): qty 1

## 2018-11-24 MED ORDER — ATORVASTATIN CALCIUM 40 MG PO TABS
40.0000 mg | ORAL_TABLET | Freq: Every day | ORAL | Status: DC
  Administered 2018-11-24 – 2018-11-28 (×5): 40 mg via ORAL
  Filled 2018-11-24 (×8): qty 1

## 2018-11-24 MED ORDER — CLONIDINE HCL 0.1 MG PO TABS
0.1000 mg | ORAL_TABLET | Freq: Four times a day (QID) | ORAL | Status: AC
  Administered 2018-11-24 – 2018-11-27 (×10): 0.1 mg via ORAL
  Filled 2018-11-24 (×12): qty 1

## 2018-11-24 MED ORDER — METHOCARBAMOL 500 MG PO TABS
500.0000 mg | ORAL_TABLET | Freq: Three times a day (TID) | ORAL | Status: DC | PRN
  Administered 2018-11-24 – 2018-11-26 (×4): 500 mg via ORAL
  Filled 2018-11-24 (×4): qty 1

## 2018-11-24 MED ORDER — METFORMIN HCL 500 MG PO TABS
500.0000 mg | ORAL_TABLET | Freq: Two times a day (BID) | ORAL | Status: DC
  Administered 2018-11-24 (×2): 500 mg via ORAL
  Filled 2018-11-24 (×2): qty 1

## 2018-11-24 MED ORDER — HYDROXYZINE HCL 25 MG PO TABS
25.0000 mg | ORAL_TABLET | Freq: Once | ORAL | Status: AC
  Administered 2018-11-24: 15:00:00 25 mg via ORAL
  Filled 2018-11-24: qty 1

## 2018-11-24 MED ORDER — LOPERAMIDE HCL 2 MG PO CAPS: 2.0000 mg | ORAL_CAPSULE | ORAL | Status: DC | PRN

## 2018-11-24 MED ORDER — CLONIDINE HCL 0.1 MG PO TABS
0.1000 mg | ORAL_TABLET | ORAL | Status: AC
  Administered 2018-11-27 – 2018-11-29 (×4): 0.1 mg via ORAL
  Filled 2018-11-24 (×4): qty 1

## 2018-11-24 MED ORDER — GLIPIZIDE 5 MG PO TABS
5.0000 mg | ORAL_TABLET | Freq: Every day | ORAL | Status: DC
  Administered 2018-11-24: 09:00:00 5 mg via ORAL
  Filled 2018-11-24: qty 1

## 2018-11-24 MED ORDER — ONDANSETRON 4 MG PO TBDP: 4.0000 mg | ORAL_TABLET | Freq: Four times a day (QID) | ORAL | Status: DC | PRN

## 2018-11-24 MED ORDER — HYDROXYZINE HCL 25 MG PO TABS
25.0000 mg | ORAL_TABLET | Freq: Once | ORAL | Status: AC
  Administered 2018-11-24: 25 mg via ORAL
  Filled 2018-11-24: qty 1

## 2018-11-24 MED ORDER — ACETAMINOPHEN 325 MG PO TABS
650.0000 mg | ORAL_TABLET | Freq: Four times a day (QID) | ORAL | Status: DC | PRN
  Administered 2018-11-24 – 2018-11-29 (×6): 650 mg via ORAL
  Filled 2018-11-24 (×6): qty 2

## 2018-11-24 NOTE — ED Notes (Signed)
EDP aware of blood sugar, repeat prior to lunch

## 2018-11-24 NOTE — ED Notes (Signed)
Second call from [facility] stated that because the pt is a veteran he can go to the [facility] hospital and she checked they do have available beds so Dave Moran will not be returning to [facility] but will be going to the [place]. [facility] was asked to keep [place] informed of transportation arrangements and she agreed.

## 2018-11-24 NOTE — ED Notes (Signed)
Sitter no longer available, charge nurse aware.

## 2018-11-24 NOTE — ED Provider Notes (Signed)
Care assumed from Dr. Pilar Plate at shift change.  Patient sent here from behavioral health for evaluation of lower abdominal pain.  He had tenderness to the right lower quadrant and a slightly elevated white count.  Care signed out to me waiting for results of a CT scan.  This has returned and is unremarkable for early emergent intra-abdominal process.  He does have an increased stool burden.  Patient will be recommended magnesium citrate, but appears comfortable and appropriate for discharge.   Geoffery Lyons, MD 11/24/18 [phone]

## 2018-11-24 NOTE — ED Notes (Addendum)
Call to [facility] behavioral health COVID 19 test results were requested by [facility] before sending pt  Back to [facility]

## 2018-11-24 NOTE — BH Assessment (Signed)
[facility] Assessment Progress Note  Per Juanetta Beets, DO, this pt requires psychiatric hospitalization at this time.  Malva Limes, RN, [facility] has assigned pt to [facility] Rm [id number].  Pt has signed Voluntary Admission and Consent for Treatment, as well as Consent to Release Information to his fiancee, and signed forms have been faxed to [facility].  Pt's nurse, Waynetta Sandy, has been notified, and agrees to send original paperwork along with pt via Juel Burrow, and to call report to [phone].  Doylene Canning, [place] [facility] Coordinator [phone]

## 2018-11-24 NOTE — Consult Note (Addendum)
Telepsych Consultation   Reason for Consult:  Suicidal ideation Referring Physician:  EDP Location of Patient: [facility] Location of Provider: [facility]  Patient Identification: Dave Moran MRN:  [id number] Principal Diagnosis: Opioid use with opioid-induced mood disorder ([place]) Diagnosis:  Principal Problem:   Opioid use with opioid-induced mood disorder ([place]) Active Problems:   PTSD (post-traumatic stress disorder)   Total Time spent with patient: 30 minutes  Subjective:   Dave Moran is a 53 y.o. male patient admitted with suicidal ideation and relapse on opioids.  HPI:  Pt was seen and chart reviewed with treatment team and Dr Mariea Clonts. 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 His UDS was positive for opiates, BAL negative. Pt stated he has a history of PTSD and depression but has not taken any medications for either for at least one year. Pt lives with his fiance' and her three children ages 80, 30, and 36. He stated he attempted suicide 6 years ago by alcohol and pills but [facility]. Pt is endorsing suicidal ideations and stated he does not feel safe going home. He stated he needs help with his depression, anxiety, PTSD and substance use. He has remained calm and cooperative in the ED but does appear anxious. Pt is recommended for inpatient treatment for stabilization.   Past Psychiatric History: As above  Risk to Self:  Yes  Risk to Others:  No Prior Inpatient Therapy:  None per chart review. Prior Outpatient Therapy:  He is followed at the [place].   Past Medical History:  Past Medical History:  Diagnosis Date  . Chest pain 07/09/2018  . Diabetes mellitus type 2, noninsulin dependent ([place])   . Essential hypertension   . Hypertensive urgency   . Hypokalemia     Past Surgical History:  Procedure Laterality Date  . CHOLECYSTECTOMY     Family History: History reviewed. No pertinent family  history. Family Psychiatric  History: Pt denies Social History:  Social History   Substance and Sexual Activity  Alcohol Use Yes     Social History   Substance and Sexual Activity  Drug Use Never    Social History   Socioeconomic History  . Marital status: Significant Other    Spouse name: Not on file  . Number of children: Not on file  . Years of education: Not on file  . Highest education level: Not on file  Occupational History  . Not on file  Social Needs  . Financial resource strain: Not on file  . Food insecurity:    Worry: Not on file    Inability: Not on file  . Transportation needs:    Medical: Not on file    Non-medical: Not on file  Tobacco Use  . Smoking status: Current Every Day Smoker    Types: Cigarettes  . Smokeless tobacco: Never Used  Substance and Sexual Activity  . Alcohol use: Yes  . Drug use: Never  . Sexual activity: Not on file  Lifestyle  . Physical activity:    Days per week: Not on file    Minutes per session: Not on file  . Stress: Not on file  Relationships  . Social connections:    Talks on phone: Not on file    Gets together: Not on file    Attends religious service: Not on file    Active member of club or organization: Not on file    Attends meetings of  clubs or organizations: Not on file    Relationship status: Not on file  Other Topics Concern  . Not on file  Social History Narrative  . Not on file   Additional Social History: N/A    Allergies:  No Known Allergies  Labs:  Results for orders placed or performed during the hospital encounter of 11/23/18 (from the past 48 hour(s))  Comprehensive metabolic panel     Status: Abnormal   Collection Time: 11/23/18  9:42 PM  Result Value Ref Range   Sodium 132 (L) 135 - 145 mmol/L   Potassium 3.6 3.5 - 5.1 mmol/L   Chloride 98 98 - 111 mmol/L   CO2 22 22 - 32 mmol/L   Glucose, Bld 294 (H) 70 - 99 mg/dL   BUN 9 6 - 20 mg/dL   Creatinine, Ser 0.80 0.61 - 1.24 mg/dL    Calcium 9.2 8.9 - 10.3 mg/dL   Total Protein 7.5 6.5 - 8.1 g/dL   Albumin 4.4 3.5 - 5.0 g/dL   AST 13 (L) 15 - 41 U/L   ALT 18 0 - 44 U/L   Alkaline Phosphatase 86 38 - 126 U/L   Total Bilirubin 0.8 0.3 - 1.2 mg/dL   GFR calc non Af Amer >60 >60 mL/min   GFR calc Af Amer >60 >60 mL/min   Anion gap 12 5 - 15    Comment: Performed at [facility], [place] [address] [place], [place] [address]  Ethanol     Status: None   Collection Time: 11/23/18  9:42 PM  Result Value Ref Range   Alcohol, Ethyl (B) <10 <10 mg/dL    Comment: (NOTE) Lowest detectable limit for serum alcohol is 10 mg/dL. For medical purposes only. Performed at [facility], [place] [address], [place], [place] [address]   Salicylate level     Status: None   Collection Time: 11/23/18  9:42 PM  Result Value Ref Range   Salicylate Lvl <7.3 2.8 - 30.0 mg/dL    Comment: Performed at [facility], [place] [address], [place], [place] [address]  Acetaminophen level     Status: Abnormal   Collection Time: 11/23/18  9:42 PM  Result Value Ref Range   Acetaminophen (Tylenol), Serum <10 (L) 10 - 30 ug/mL    Comment: (NOTE) Therapeutic concentrations vary significantly. A range of 10-30 ug/mL  may be an effective concentration for many patients. However, some  are best treated at concentrations outside of this range. Acetaminophen concentrations >150 ug/mL at 4 hours after ingestion  and >50 ug/mL at 12 hours after ingestion are often associated with  toxic reactions. Performed at [facility], [place] [address] [place], [place] [address]   cbc     Status: Abnormal   Collection Time: 11/23/18  9:42 PM  Result Value Ref Range   WBC 11.9 (H) 4.0 - 10.5 K/uL   RBC 5.84 (H) 4.22 - 5.81 MIL/uL   Hemoglobin 17.1 (H) 13.0 - 17.0 g/dL   HCT 48.3 39.0 - 52.0 %   MCV 82.7 80.0 - 100.0 fL   MCH 29.3 26.0 - 34.0 pg   MCHC 35.4 30.0 - 36.0 g/dL   RDW 12.2 11.5 - 15.5 %    Platelets 454 (H) 150 - 400 K/uL   nRBC 0.0 0.0 - 0.2 %    Comment: Performed at [facility], [place] [address], [place], [place] [address]  Rapid urine drug screen (hospital performed)  Status: Abnormal   Collection Time: 11/23/18  9:42 PM  Result Value Ref Range   Opiates POSITIVE (A) NONE DETECTED   Cocaine NONE DETECTED NONE DETECTED   Benzodiazepines NONE DETECTED NONE DETECTED   Amphetamines NONE DETECTED NONE DETECTED   Tetrahydrocannabinol NONE DETECTED NONE DETECTED   Barbiturates NONE DETECTED NONE DETECTED    Comment: (NOTE) DRUG SCREEN FOR MEDICAL PURPOSES ONLY.  IF CONFIRMATION IS NEEDED FOR ANY PURPOSE, NOTIFY LAB WITHIN 5 DAYS. LOWEST DETECTABLE LIMITS FOR URINE DRUG SCREEN Drug Class                     Cutoff (ng/mL) Amphetamine and metabolites    1000 Barbiturate and metabolites    200 Benzodiazepine                 540 Tricyclics and metabolites     300 Opiates and metabolites        300 Cocaine and metabolites        300 THC                            50 Performed at [facility], [place] [address], [place], [place] [address]   Lipase, blood     Status: None   Collection Time: 11/23/18 10:21 PM  Result Value Ref Range   Lipase 34 11 - 51 U/L    Comment: Performed at [facility], [place] [address] [place],  [address]  SARS Coronavirus 2 [facility] order, Performed in [facility] hospital lab)     Status: None   Collection Time: 11/24/18  1:21 AM  Result Value Ref Range   SARS Coronavirus 2 NEGATIVE NEGATIVE    Comment: (NOTE) If result is NEGATIVE SARS-CoV-2 target nucleic acids are NOT DETECTED. The SARS-CoV-2 RNA is generally detectable in upper and lower  respiratory specimens during the acute phase of infection. The lowest  concentration of SARS-CoV-2 viral copies this assay can detect is 250  copies / mL. A negative result does not preclude SARS-CoV-2 infection  and should not be used  as the sole basis for treatment or other  patient management decisions.  A negative result may occur with  improper specimen collection / handling, submission of specimen other  than nasopharyngeal swab, presence of viral mutation(s) within the  areas targeted by this assay, and inadequate number of viral copies  (<250 copies / mL). A negative result must be combined with clinical  observations, patient history, and epidemiological information. If result is POSITIVE SARS-CoV-2 target nucleic acids are DETECTED. The SARS-CoV-2 RNA is generally detectable in upper and lower  respiratory specimens dur ing the acute phase of infection.  Positive  results are indicative of active infection with SARS-CoV-2.  Clinical  correlation with patient history and other diagnostic information is  necessary to determine patient infection status.  Positive results do  not rule out bacterial infection or co-infection with other viruses. If result is PRESUMPTIVE POSTIVE SARS-CoV-2 nucleic acids MAY BE PRESENT.   A presumptive positive result was obtained on the submitted specimen  and confirmed on repeat testing.  While 2019 novel coronavirus  (SARS-CoV-2) nucleic acids may be present in the submitted sample  additional confirmatory testing may be necessary for epidemiological  and / or clinical management purposes  to differentiate between  SARS-CoV-2 and other Sarbecovirus currently known to infect humans.  If clinically indicated additional testing with an alternate test  methodology [phone]) is advised. The SARS-CoV-2 RNA is generally  detectable in upper and lower respiratory sp ecimens during the acute  phase of infection. The expected result is Negative. Fact Sheet for Patients:  StrictlyIdeas.no Fact Sheet for Healthcare Providers: BankingDealers.co.za This test is not yet approved or cleared by the [place] FDA and has been authorized for  detection and/or diagnosis of SARS-CoV-2 by FDA under an Emergency Use Authorization (EUA).  This EUA will remain in effect (meaning this test can be used) for the duration of the COVID-19 declaration under Section 564(b)(1) of the Act, 21 U.S.C. section 360bbb-3(b)(1), unless the authorization is terminated or revoked sooner. Performed at [facility], [place] [address], [place], [place] [address]   CBG monitoring, ED     Status: Abnormal   Collection Time: 11/24/18  8:29 AM  Result Value Ref Range   Glucose-Capillary 416 (H) 70 - 99 mg/dL  CBG monitoring, ED     Status: Abnormal   Collection Time: 11/24/18 11:18 AM  Result Value Ref Range   Glucose-Capillary 181 (H) 70 - 99 mg/dL    Medications:  Current Facility-Administered Medications  Medication Dose Route Frequency Provider Last Rate Last Dose  . acetaminophen (TYLENOL) tablet 1,000 mg  1,000 mg Oral Q6H PRN Deno Etienne, DO   1,000 mg at 11/24/18 0904  . atorvastatin (LIPITOR) tablet 40 mg  40 mg Oral q1800 Deno Etienne, DO      . glipiZIDE (GLUCOTROL) tablet 5 mg  5 mg Oral Q breakfast Deno Etienne, DO   5 mg at 11/24/18 6222  . ibuprofen (ADVIL) tablet 400 mg  400 mg Oral Q8H PRN Deno Etienne, DO      . losartan (COZAAR) tablet 50 mg  50 mg Oral Daily Deno Etienne, DO   50 mg at 11/24/18 1027  . metFORMIN (GLUCOPHAGE) tablet 500 mg  500 mg Oral BID WC Deno Etienne, DO   500 mg at 11/24/18 9798   Current Outpatient Medications  Medication Sig Dispense Refill  . atorvastatin (LIPITOR) 40 MG tablet Take 1 tablet (40 mg total) by mouth daily at 6 PM. 30 tablet 0  . glipiZIDE (GLUCOTROL) 5 MG tablet Take 5 mg by mouth daily.    Marland Kitchen losartan (COZAAR) 50 MG tablet Take 1 tablet (50 mg total) by mouth daily. 30 tablet 0  . metFORMIN (GLUCOPHAGE) 500 MG tablet Take 1 tablet (500 mg total) by mouth 2 (two) times daily with a meal. 60 tablet 0  . blood glucose meter kit and supplies KIT Dispense glucometer based on patient and  insurance preference. Use up to four times daily as directed. (FOR ICD-9 250.00, 250.01). 1 each 0  . glipiZIDE (GLUCOTROL) 5 MG tablet Take 1 tablet (5 mg total) by mouth 2 (two) times daily before a meal. (Patient not taking: Reported on 11/23/2018) 60 tablet 0  . polyethylene glycol (MIRALAX / GLYCOLAX) packet Take 17 g by mouth 2 (two) times daily. (Patient not taking: Reported on 11/23/2018) 14 each 0    Musculoskeletal: Strength & Muscle Tone: within normal limits Gait & Station: normal Patient leans: N/A  Psychiatric Specialty Exam: Physical Exam  Nursing note and vitals reviewed. Constitutional: He is oriented to person, place, and time. He appears well-developed and well-nourished.  HENT:  Head: Normocephalic and atraumatic.  Neck: Normal range of motion.  Respiratory: Effort normal.  Musculoskeletal: Normal range of motion.  Neurological: He is alert and oriented to person, place, and time.  Psychiatric: His  speech is normal and behavior is normal. Judgment normal. His mood appears anxious. Cognition and memory are normal. He exhibits a depressed mood. He expresses suicidal ideation. He expresses suicidal plans.    Review of Systems  Psychiatric/Behavioral: Positive for depression, substance abuse and suicidal ideas. The patient is nervous/anxious.   All other systems reviewed and are negative.   Blood pressure (!) 161/103, pulse 85, temperature 98.7 F (37.1 C), temperature source Oral, resp. rate 19, SpO2 100 %.There is no height or weight on file to calculate BMI.  General Appearance: Casual  Eye Contact:  Good  Speech:  Clear and Coherent and Normal Rate  Volume:  Normal  Mood:  Anxious and Depressed  Affect:  Congruent and Depressed  Thought Process:  Coherent, Goal Directed and Descriptions of Associations: Intact  Orientation:  Full (Time, Place, and Person)  Thought Content:  Logical  Suicidal Thoughts:  Yes.  with intent/plan  Homicidal Thoughts:  No  Memory:   Immediate;   Good Recent;   Good Remote;   Fair  Judgement:  Fair  Insight:  Present  Psychomotor Activity:  Normal  Concentration:  Concentration: Good and Attention Span: Good  Recall:  Good  Fund of Knowledge:  Good  Language:  Good  Akathisia:  Negative  Handed:  Right  AIMS (if indicated):   N/A  Assets:  Communication Skills Desire for Improvement Financial Resources/Insurance Housing Physical Health Resilience Social Support Vocational/Educational  ADL's:  Intact  Cognition:  WNL  Sleep:   Poor     Treatment Plan Summary: Daily contact with patient to assess and evaluate symptoms and progress in treatment and Medication management  Disposition: Recommend psychiatric Inpatient admission when medically cleared.  This service was provided via telemedicine using a 2-way, interactive audio and video technology.  Names of all persons participating in this telemedicine service and their role in this encounter. Name: Dave Moran Role: patient  Name: Jinny Blossom Role: FNP-C  Name: Buford Dresser, DO Role: Psychiatrist    Ethelene Hal, NP 11/24/2018 12:00 PM   Patient seen by telemedicine, chart reviewed and case discussed with the physician extender and developed treatment plan. Reviewed the information documented and agree with the treatment plan.  Buford Dresser, DO 11/24/18 3:40 PM

## 2018-11-24 NOTE — ED Notes (Signed)
Ambulatory w/o difficulty to room 29

## 2018-11-24 NOTE — ED Notes (Signed)
Call from [facility] RN [facility] all paper work faxed to [facility] this AM waiting reply and acceptance for admission. If pt no accepted by [facility], [facility] will admit to their facility.

## 2018-11-24 NOTE — ED Notes (Signed)
Pt off unit to [facility] per MD. Pt calm, cooperative, no s/s of distress. Pt DC information given to [place] driver for [facility]. Pt belongings given to [employer] driver for [facility] . Pt ambulatory off unit, escort with tech. Pt transported by [place]

## 2018-11-24 NOTE — ED Notes (Addendum)
KIM RN [facility] notified of COVID 14 results  States she is working on pt transfer to [place]  and  will update transfer arrangements in pt chart

## 2018-11-24 NOTE — Tx Team (Signed)
Initial Treatment Plan 11/24/2018 6:55 PM Dave Moran [id number]:[id number]    PATIENT STRESSORS: Medication change or noncompliance Substance abuse   PATIENT STRENGTHS: General fund of knowledge Physical Health Supportive family/friends   PATIENT IDENTIFIED PROBLEMS: "get back on medicines"  "detox"  Suicide Risk  Substance Abuse               DISCHARGE CRITERIA:  Ability to meet basic life and health needs Adequate post-discharge living arrangements Improved stabilization in mood, thinking, and/or behavior Medical problems require only outpatient monitoring Motivation to continue treatment in a less acute level of care Need for constant or close observation no longer present Reduction of life-threatening or endangering symptoms to within safe limits Safe-care adequate arrangements made Verbal commitment to aftercare and medication compliance Withdrawal symptoms are absent or subacute and managed without 24-hour nursing intervention  PRELIMINARY DISCHARGE PLAN: Outpatient therapy  PATIENT/FAMILY INVOLVEMENT: This treatment plan has been presented to and reviewed with the patient, Dave Moran.  The patient and family have been given the opportunity to ask questions and make suggestions.  Ferrel Logan, RN 11/24/2018, 6:55 PM

## 2018-11-24 NOTE — ED Notes (Signed)
Bed: WA29 Expected date:  Expected time:  Means of arrival:  Comments: Hold for room 13

## 2018-11-24 NOTE — ED Notes (Signed)
Call to [facility] waiting information on pt transfer to [facility]

## 2018-11-24 NOTE — ED Notes (Signed)
Pt alert/oriented x3, reports that he is here for detox from heroine(snorts).  Last used 2 days ago.  Pt reports that he had been clean until 4 months ago when he relapsed.  Pt reports that he was having suicidal thoughts w/ a plan to overdose, but his friend contacted the crisis line and he come in for help.  Pt denies current si/hi/avh.  Pt also reports that he has been off his regular medications x4 months.  Procedures explained.  Pt also reports that he is continuing to have rt lower quad pain, and that he has been having it for several weeks.

## 2018-11-24 NOTE — ED Notes (Signed)
Up in room, nad

## 2018-11-24 NOTE — Progress Notes (Signed)
Inpatient Diabetes Program Recommendations  AACE/ADA: New Consensus Statement on Inpatient Glycemic Control (2015)  Target Ranges:  Prepandial:   less than 140 mg/dL      Peak postprandial:   less than 180 mg/dL (1-2 hours)      Critically ill patients:  140 - 180 mg/dL   Lab Results  Component Value Date   GLUCAP 181 (H) 11/24/2018   HGBA1C 10.6 (H) 07/10/2018    Review of Glycemic Control  Diabetes history: DM2 Outpatient Diabetes medications: metformin 500 mg bid, glipizide 5 mg QAM Current orders for Inpatient glycemic control: metformin 500 mg bid, glipizide 5 mg QAM  Need updated HgbA1C. Last one 10.6% on 07/10/18.  Inpatient Diabetes Program Recommendations:     Add Novolog 0-9 units tidwc and hs Change diet to CHO mod med  Will follow.  Thank you. Ailene Ards, RD, LDN, CDE Inpatient Diabetes Coordinator [phone]

## 2018-11-24 NOTE — Progress Notes (Signed)
Patient ID: Dave Moran, male   DOB: March 21, 1966, 53 y.o.   MRN: [id number]  Admission Note  Patient admitted to the 300 hall for SI with plan to OD on heroin. Patient states he has been off his medications for five months and that he is wanting to detox. Patient reports his fiance has been his support and that he would not be here if not for her.  Skin assessment complete and was unremarkable. Belongings searched with no contraband found and secured in locker. Vitals, height and weight obtained.  Patient with mild complaints of withdrawal symptoms and abdominal pain. Patient oriented to the unit with no concerns and is resting in bed. Medications provided as ordered.

## 2018-11-24 NOTE — Plan of Care (Signed)
D: Patient is in bed on approach. Patient is alert, oriented, pleasant, and cooperative. Denies SI, HI, AVH, and verbally contracts for safety. Patient reports withdrawal and cravings. COWS score 10.    A: Medications administered per MD order. Support provided. Patient educated on safety on the unit and medications. Routine safety checks every 15 minutes. Patient stated understanding to tell nurse about any new physical symptoms. Patient understands to tell staff of any needs.     R: No adverse drug reactions noted. Patient verbally contracts for safety. Patient remains safe at this time and will continue to monitor.   Problem: Education: Goal: Knowledge of [place] [facility] Education information/materials will improve Outcome: Progressing   Problem: Safety: Goal: Periods of time without injury will increase Outcome: Progressing  Patient is oriented to the unit. Patient remains safe and will continue to monitor.   [place] NOVEL CORONAVIRUS (COVID-19) DAILY CHECK-OFF SYMPTOMS - answer yes or no to each - every day NO YES  Have you had a fever in the past 24 hours?  Fever (Temp > 37.80C / 100F) X   Have you had any of these symptoms in the past 24 hours? New Cough  Sore Throat   Shortness of Breath  Difficulty Breathing  Unexplained Body Aches   X   Have you had any one of these symptoms in the past 24 hours not related to allergies?   Runny Nose  Nasal Congestion  Sneezing   X   If you have had runny nose, nasal congestion, sneezing in the past 24 hours, has it worsened?  X   EXPOSURES - check yes or no X   Have you traveled outside the state in the past 14 days?  X   Have you been in contact with someone with a confirmed diagnosis of COVID-19 or PUI in the past 14 days without wearing appropriate PPE?  X   Have you been living in the same home as a person with confirmed diagnosis of COVID-19 or a PUI (household contact)?    X   Have you been diagnosed with COVID-19?     X              What to do next: Answered NO to all: Answered YES to anything:   Proceed with unit schedule Follow the [facility] Inpatient Flowsheet.

## 2018-11-24 NOTE — Discharge Instructions (Addendum)
Magnesium citrate: Drink the entire 10 ounce bottle mixed with equal parts Sprite or Gatorade for relief of constipation.  Return to the emergency department if you develop severe abdominal pain, high fever, bloody stools, or other new and concerning symptoms.

## 2018-11-25 DIAGNOSIS — F332 Major depressive disorder, recurrent severe without psychotic features: Principal | ICD-10-CM

## 2018-11-25 LAB — GLUCOSE, CAPILLARY: Glucose-Capillary: 244 mg/dL — ABNORMAL HIGH (ref 70–99)

## 2018-11-25 MED ORDER — PROPRANOLOL HCL 20 MG PO TABS
20.0000 mg | ORAL_TABLET | Freq: Three times a day (TID) | ORAL | Status: DC
  Administered 2018-11-25 – 2018-11-28 (×9): 20 mg via ORAL
  Filled 2018-11-25 (×8): qty 1
  Filled 2018-11-25: qty 2
  Filled 2018-11-25 (×4): qty 1

## 2018-11-25 MED ORDER — NICOTINE POLACRILEX 2 MG MT GUM
CHEWING_GUM | OROMUCOSAL | Status: AC
  Administered 2018-11-25: 09:00:00
  Filled 2018-11-25: qty 1

## 2018-11-25 MED ORDER — PRAZOSIN HCL 2 MG PO CAPS
4.0000 mg | ORAL_CAPSULE | Freq: Every day | ORAL | Status: DC
  Administered 2018-11-25 – 2018-11-28 (×4): 4 mg via ORAL
  Filled 2018-11-25 (×6): qty 2

## 2018-11-25 MED ORDER — METHOCARBAMOL 750 MG PO TABS
750.0000 mg | ORAL_TABLET | Freq: Three times a day (TID) | ORAL | Status: DC
  Administered 2018-11-25 – 2018-11-29 (×13): 750 mg via ORAL
  Filled 2018-11-25 (×19): qty 1

## 2018-11-25 MED ORDER — FLUOXETINE HCL 20 MG PO CAPS
20.0000 mg | ORAL_CAPSULE | Freq: Every day | ORAL | Status: DC
  Administered 2018-11-25 – 2018-11-29 (×5): 20 mg via ORAL
  Filled 2018-11-25 (×8): qty 1

## 2018-11-25 MED ORDER — NICOTINE POLACRILEX 2 MG MT GUM
2.0000 mg | CHEWING_GUM | OROMUCOSAL | Status: DC | PRN
  Administered 2018-11-25: 2 mg via ORAL

## 2018-11-25 MED ORDER — GABAPENTIN 300 MG PO CAPS
300.0000 mg | ORAL_CAPSULE | Freq: Three times a day (TID) | ORAL | Status: DC
  Administered 2018-11-25 – 2018-11-29 (×13): 300 mg via ORAL
  Filled 2018-11-25 (×18): qty 1

## 2018-11-25 MED ORDER — NICOTINE 21 MG/24HR TD PT24
21.0000 mg | MEDICATED_PATCH | Freq: Every day | TRANSDERMAL | Status: DC
  Administered 2018-11-25 – 2018-11-29 (×5): 21 mg via TRANSDERMAL
  Filled 2018-11-25 (×6): qty 1

## 2018-11-25 NOTE — Progress Notes (Signed)
D:Pt has reported multiple detox symptoms. He rates depression as a 5 and anxiety as a 7 on 0-10 scale with 10 being the most. Pt is pleasant interacting on the unit.  A:Offered support, encouragement and 15 minute checks. Medications given as ordered.  R:Pt denies si and hi. Safety maintained on the unit.

## 2018-11-25 NOTE — Tx Team (Signed)
Interdisciplinary Treatment and Diagnostic Plan Update  11/25/2018 Time of Session: [place] MRN: [id number]  Principal Diagnosis: <principal problem not specified>  Secondary Diagnoses: Active Problems:   MDD (major depressive disorder), recurrent severe, without psychosis ([place])   Current Medications:  Current Facility-Administered Medications  Medication Dose Route Frequency Provider Last Rate Last Dose  . acetaminophen (TYLENOL) tablet 650 mg  650 mg Oral Q6H PRN Ethelene Hal, NP   650 mg at 11/25/18 0158  . alum & mag hydroxide-simeth (MAALOX/MYLANTA) 200-200-20 MG/5ML suspension 30 mL  30 mL Oral Q4H PRN Ethelene Hal, NP      . atorvastatin (LIPITOR) tablet 40 mg  40 mg Oral q1800 Ethelene Hal, NP   40 mg at 11/24/18 1809  . cloNIDine (CATAPRES) tablet 0.1 mg  0.1 mg Oral QID Laverle Hobby, PA-C   0.1 mg at 11/25/18 4403   Followed by  . [START ON 11/27/2018] cloNIDine (CATAPRES) tablet 0.1 mg  0.1 mg Oral BH-qamhs Simon, Spencer E, PA-C       Followed by  . [START ON 11/30/2018] cloNIDine (CATAPRES) tablet 0.1 mg  0.1 mg Oral QAC breakfast Patriciaann Clan E, PA-C      . dicyclomine (BENTYL) tablet 20 mg  20 mg Oral Q6H PRN Patriciaann Clan E, PA-C   20 mg at 11/25/18 0516  . FLUoxetine (PROZAC) capsule 20 mg  20 mg Oral Daily Johnn Hai, MD   20 mg at 11/25/18 1010  . gabapentin (NEURONTIN) capsule 300 mg  300 mg Oral TID Johnn Hai, MD      . glipiZIDE (GLUCOTROL) tablet 5 mg  5 mg Oral Q breakfast Ethelene Hal, NP   5 mg at 11/25/18 0819  . hydrOXYzine (ATARAX/VISTARIL) tablet 25 mg  25 mg Oral Q6H PRN Laverle Hobby, PA-C   25 mg at 11/25/18 4742  . loperamide (IMODIUM) capsule 2-4 mg  2-4 mg Oral PRN Patriciaann Clan E, PA-C      . magnesium hydroxide (MILK OF MAGNESIA) suspension 30 mL  30 mL Oral Daily PRN Ethelene Hal, NP      . metFORMIN (GLUCOPHAGE) tablet 500 mg  500 mg Oral BID WC Ethelene Hal, NP   500 mg  at 11/25/18 0819  . methocarbamol (ROBAXIN) tablet 500 mg  500 mg Oral Q8H PRN Laverle Hobby, PA-C   500 mg at 11/25/18 5956  . methocarbamol (ROBAXIN) tablet 750 mg  750 mg Oral TID Johnn Hai, MD      . naproxen (NAPROSYN) tablet 500 mg  500 mg Oral BID PRN Laverle Hobby, PA-C   500 mg at 11/25/18 0516  . nicotine (NICODERM CQ - dosed in mg/24 hours) patch 21 mg  21 mg Transdermal Daily Cobos, Myer Peer, MD   21 mg at 11/25/18 1056  . ondansetron (ZOFRAN-ODT) disintegrating tablet 4 mg  4 mg Oral Q6H PRN Laverle Hobby, PA-C      . prazosin (MINIPRESS) capsule 4 mg  4 mg Oral QHS Johnn Hai, MD      . propranolol (INDERAL) tablet 20 mg  20 mg Oral TID Johnn Hai, MD      . traZODone (DESYREL) tablet 50 mg  50 mg Oral QHS PRN Ethelene Hal, NP   50 mg at 11/24/18 2049   PTA Medications: Medications Prior to Admission  Medication Sig Dispense Refill Last Dose  . atorvastatin (LIPITOR) 40 MG tablet Take 1 tablet (40 mg total) by mouth  daily at 6 PM. 30 tablet 0 4 months at Unknown time  . blood glucose meter kit and supplies KIT Dispense glucometer based on patient and insurance preference. Use up to four times daily as directed. (FOR ICD-9 250.00, 250.01). 1 each 0   . glipiZIDE (GLUCOTROL) 5 MG tablet Take 1 tablet (5 mg total) by mouth 2 (two) times daily before a meal. (Patient not taking: Reported on 11/23/2018) 60 tablet 0 4 months  . glipiZIDE (GLUCOTROL) 5 MG tablet Take 5 mg by mouth daily.   4 months at Unknown time  . losartan (COZAAR) 50 MG tablet Take 1 tablet (50 mg total) by mouth daily. 30 tablet 0 4 months at Unknown time  . metFORMIN (GLUCOPHAGE) 500 MG tablet Take 1 tablet (500 mg total) by mouth 2 (two) times daily with a meal. 60 tablet 0 4 months at Unknown time  . polyethylene glycol (MIRALAX / GLYCOLAX) packet Take 17 g by mouth 2 (two) times daily. (Patient not taking: Reported on 11/23/2018) 14 each 0 Not Taking at Unknown time    Patient Stressors:  Medication change or noncompliance Substance abuse  Patient Strengths: General fund of knowledge Physical Health Supportive family/friends  Treatment Modalities: Medication Management, Group therapy, Case management,  1 to 1 session with clinician, Psychoeducation, Recreational therapy.   Physician Treatment Plan for Primary Diagnosis: <principal problem not specified> Long Term Goal(s): Improvement in symptoms so as ready for discharge Improvement in symptoms so as ready for discharge   Short Term Goals: Ability to maintain clinical measurements within normal limits will improve Ability to demonstrate self-control will improve  Medication Management: Evaluate patient's response, side effects, and tolerance of medication regimen.  Therapeutic Interventions: 1 to 1 sessions, Unit Group sessions and Medication administration.  Evaluation of Outcomes: Progressing  Physician Treatment Plan for Secondary Diagnosis: Active Problems:   MDD (major depressive disorder), recurrent severe, without psychosis ([place])  Long Term Goal(s): Improvement in symptoms so as ready for discharge Improvement in symptoms so as ready for discharge   Short Term Goals: Ability to maintain clinical measurements within normal limits will improve Ability to demonstrate self-control will improve     Medication Management: Evaluate patient's response, side effects, and tolerance of medication regimen.  Therapeutic Interventions: 1 to 1 sessions, Unit Group sessions and Medication administration.  Evaluation of Outcomes: Progressing   RN Treatment Plan for Primary Diagnosis: <principal problem not specified> Long Term Goal(s): Knowledge of disease and therapeutic regimen to maintain health will improve  Short Term Goals: Ability to identify and develop effective coping behaviors will improve and Compliance with prescribed medications will improve  Medication Management: RN will administer medications as  ordered by provider, will assess and evaluate patient's response and provide education to patient for prescribed medication. RN will report any adverse and/or side effects to prescribing provider.  Therapeutic Interventions: 1 on 1 counseling sessions, Psychoeducation, Medication administration, Evaluate responses to treatment, Monitor vital signs and CBGs as ordered, Perform/monitor CIWA, COWS, AIMS and Fall Risk screenings as ordered, Perform wound care treatments as ordered.  Evaluation of Outcomes: Progressing   LCSW Treatment Plan for Primary Diagnosis: <principal problem not specified> Long Term Goal(s): Safe transition to appropriate next level of care at discharge, Engage patient in therapeutic group addressing interpersonal concerns.  Short Term Goals: Engage patient in aftercare planning with referrals and resources, Increase social support and Increase skills for wellness and recovery  Therapeutic Interventions: Assess for all discharge needs, 1 to 1 time with Social  worker, Explore available resources and support systems, Assess for adequacy in community support network, Educate family and significant other(s) on suicide prevention, Complete Psychosocial Assessment, Interpersonal group therapy.  Evaluation of Outcomes: Progressing   Progress in Treatment: Attending groups: No. Participating in groups: No. Taking medication as prescribed: Yes. Toleration medication: Yes. Family/Significant other contact made: No, will contact:  when given permission Patient understands diagnosis: Yes. Discussing patient identified problems/goals with staff: Yes. Medical problems stabilized or resolved: Yes. Denies suicidal/homicidal ideation: Yes. Issues/concerns per patient self-inventory: No. Other: none  New problem(s) identified: No, Describe:  none  New Short Term/Long Term Goal(s):  Patient Goals:  "Detox and get into a better mental state"  Discharge Plan or Barriers:   Reason  for Continuation of Hospitalization: Depression Medication stabilization  Estimated Length of Stay: 3-5 days  Attendees: Patient: Dave Moran 11/25/2018  Physician:  11/25/2018   Nursing:  11/25/2018   RN Care Manager: 11/25/2018   Social Worker: Lurline Idol, LCSW 11/25/2018   Recreational Therapist:  11/25/2018   Other:  11/25/2018   Other:  11/25/2018   Other: 11/25/2018        Scribe for Treatment Team: Joanne Chars, [place] 11/25/2018 12:18 PM

## 2018-11-25 NOTE — H&P (Signed)
Psychiatric Admission Assessment Adult  Patient Identification: Dave Moran MRN:  [id number] Date of Evaluation:  11/25/2018 Chief Complaint:  MDD PTSD Principal Diagnosis: Opiate dependency/suicidal thoughts/depression/PTSD Diagnosis:  Active Problems:   MDD (major depressive disorder), recurrent severe, without psychosis ([place])  History of Present Illness:   Dave Moran is 45 [facility] thoughts he is been dependent on heroin he had relapse after 5 years of sobriety he clearly needs detox measures.  He also has history of PTSD.  Drug screen positive for opiates- Patient states that he relapsed 4 months ago and he began having suicidal thoughts told his fiance, who also agreed that he get help.  He actually overdosed while intoxicated about 6 years ago and survived that of course but he states he does not have a specific plan now.  PTSD symptoms include worsening nightmares and anxiety. States he is not currently on an antidepressant  Denies current auditory visual [place] reports cramping his blood pressure is up, he reports that he can contract for safety here understands what that means Associated Signs/Symptoms: Depression Symptoms:  fatigue, (Hypo) Manic Symptoms:  n/a Anxiety Symptoms:  Excessive Worry, Psychotic Symptoms:  n/a PTSD Symptoms: Had a traumatic exposure:  Reports PTSD from [place] experience Total Time spent with patient: 45 minutes  Past Psychiatric History: as above  Is the patient at risk to self? Yes.    Has the patient been a risk to self in the past 6 months? No.  Has the patient been a risk to self within the distant past? Yes.    Is the patient a risk to others? No.  Has the patient been a risk to others in the past 6 months? No.  Has the patient been a risk to others within the distant past? No.   Alcohol Screening: 1. How often do you have a drink containing alcohol?: Never 2. How many drinks  containing alcohol do you have on a typical day when you are drinking?: 1 or 2 3. How often do you have six or more drinks on one occasion?: Never AUDIT-C Score: 0 9. Have you or someone else been injured as a result of your drinking?: No 10. Has a relative or friend or a doctor or another health worker been concerned about your drinking or suggested you cut down?: No Alcohol Use Disorder Identification Test Final Score (AUDIT): 0 Alcohol Brief Interventions/Follow-up: AUDIT Score <7 follow-up not indicated Substance Abuse History in the last 12 months:  Yes.   Consequences of Substance Abuse: Medical Consequences:  Needs detox Previous Psychotropic Medications: Yes  Psychological Evaluations: No  Past Medical History:  Past Medical History:  Diagnosis Date  . Chest pain 07/09/2018  . Diabetes mellitus type 2, noninsulin dependent ([place])   . Essential hypertension   . Hypertensive urgency   . Hypokalemia     Past Surgical History:  Procedure Laterality Date  . CHOLECYSTECTOMY     Family History: History reviewed. No pertinent family history. Family Psychiatric  History: neg Tobacco Screening: Have you used any form of tobacco in the last 30 days? (Cigarettes, Smokeless Tobacco, Cigars, and/or Pipes): Yes Tobacco use, Select all that apply: 5 or more cigarettes per day Are you interested in Tobacco Cessation Medications?: Yes, will notify MD for an order Counseled patient on smoking cessation including recognizing danger situations, developing coping skills and basic information about quitting provided: Refused/Declined practical counseling Social History:  Social History   Substance and Sexual  Activity  Alcohol Use Yes     Social History   Substance and Sexual Activity  Drug Use Never    Additional Social History:                           Allergies:  No Known Allergies Lab Results:  Results for orders placed or performed during the hospital encounter of  11/24/18 (from the past 48 hour(s))  Glucose, capillary     Status: Abnormal   Collection Time: 11/25/18  5:50 AM  Result Value Ref Range   Glucose-Capillary 244 (H) 70 - 99 mg/dL   Comment 1 Notify RN     Blood Alcohol level:  Lab Results  Component Value Date   ETH <10 73/53/2992    Metabolic Disorder Labs:  Lab Results  Component Value Date   HGBA1C 10.6 (H) 07/10/2018   MPG 257.52 07/10/2018   No results found for: PROLACTIN Lab Results  Component Value Date   CHOL 183 07/10/2018   TRIG 139 07/10/2018   HDL 54 07/10/2018   CHOLHDL 3.4 07/10/2018   VLDL 28 07/10/2018   LDLCALC 101 (H) 07/10/2018    Current Medications: Current Facility-Administered Medications  Medication Dose Route Frequency Provider Last Rate Last Dose  . acetaminophen (TYLENOL) tablet 650 mg  650 mg Oral Q6H PRN Ethelene Hal, NP   650 mg at 11/25/18 0158  . alum & mag hydroxide-simeth (MAALOX/MYLANTA) 200-200-20 MG/5ML suspension 30 mL  30 mL Oral Q4H PRN Ethelene Hal, NP      . atorvastatin (LIPITOR) tablet 40 mg  40 mg Oral q1800 Ethelene Hal, NP   40 mg at 11/24/18 1809  . cloNIDine (CATAPRES) tablet 0.1 mg  0.1 mg Oral QID Laverle Hobby, PA-C   0.1 mg at 11/25/18 4268   Followed by  . [START ON 11/27/2018] cloNIDine (CATAPRES) tablet 0.1 mg  0.1 mg Oral BH-qamhs Simon, Spencer E, PA-C       Followed by  . [START ON 11/30/2018] cloNIDine (CATAPRES) tablet 0.1 mg  0.1 mg Oral QAC breakfast Patriciaann Clan E, PA-C      . dicyclomine (BENTYL) tablet 20 mg  20 mg Oral Q6H PRN Patriciaann Clan E, PA-C   20 mg at 11/25/18 0516  . FLUoxetine (PROZAC) capsule 20 mg  20 mg Oral Daily Johnn Hai, MD      . gabapentin (NEURONTIN) capsule 300 mg  300 mg Oral TID Johnn Hai, MD      . glipiZIDE (GLUCOTROL) tablet 5 mg  5 mg Oral Q breakfast Ethelene Hal, NP   5 mg at 11/25/18 0819  . hydrOXYzine (ATARAX/VISTARIL) tablet 25 mg  25 mg Oral Q6H PRN Laverle Hobby, PA-C    25 mg at 11/25/18 3419  . loperamide (IMODIUM) capsule 2-4 mg  2-4 mg Oral PRN Patriciaann Clan E, PA-C      . magnesium hydroxide (MILK OF MAGNESIA) suspension 30 mL  30 mL Oral Daily PRN Ethelene Hal, NP      . metFORMIN (GLUCOPHAGE) tablet 500 mg  500 mg Oral BID WC Ethelene Hal, NP   500 mg at 11/25/18 0819  . methocarbamol (ROBAXIN) tablet 500 mg  500 mg Oral Q8H PRN Laverle Hobby, PA-C   500 mg at 11/25/18 6222  . methocarbamol (ROBAXIN) tablet 750 mg  750 mg Oral TID Johnn Hai, MD      . naproxen (NAPROSYN) tablet 500  mg  500 mg Oral BID PRN Laverle Hobby, PA-C   500 mg at 11/25/18 8546  . nicotine polacrilex (NICORETTE) gum 2 mg  2 mg Oral PRN Cobos, Myer Peer, MD   2 mg at 11/25/18 0819  . ondansetron (ZOFRAN-ODT) disintegrating tablet 4 mg  4 mg Oral Q6H PRN Laverle Hobby, PA-C      . prazosin (MINIPRESS) capsule 4 mg  4 mg Oral QHS Johnn Hai, MD      . propranolol (INDERAL) tablet 20 mg  20 mg Oral TID Johnn Hai, MD      . traZODone (DESYREL) tablet 50 mg  50 mg Oral QHS PRN Ethelene Hal, NP   50 mg at 11/24/18 2049   PTA Medications: Medications Prior to Admission  Medication Sig Dispense Refill Last Dose  . atorvastatin (LIPITOR) 40 MG tablet Take 1 tablet (40 mg total) by mouth daily at 6 PM. 30 tablet 0 4 months at Unknown time  . blood glucose meter kit and supplies KIT Dispense glucometer based on patient and insurance preference. Use up to four times daily as directed. (FOR ICD-9 250.00, 250.01). 1 each 0   . glipiZIDE (GLUCOTROL) 5 MG tablet Take 1 tablet (5 mg total) by mouth 2 (two) times daily before a meal. (Patient not taking: Reported on 11/23/2018) 60 tablet 0 4 months  . glipiZIDE (GLUCOTROL) 5 MG tablet Take 5 mg by mouth daily.   4 months at Unknown time  . losartan (COZAAR) 50 MG tablet Take 1 tablet (50 mg total) by mouth daily. 30 tablet 0 4 months at Unknown time  . metFORMIN (GLUCOPHAGE) 500 MG tablet Take 1 tablet (500  mg total) by mouth 2 (two) times daily with a meal. 60 tablet 0 4 months at Unknown time  . polyethylene glycol (MIRALAX / GLYCOLAX) packet Take 17 g by mouth 2 (two) times daily. (Patient not taking: Reported on 11/23/2018) 14 each 0 Not Taking at Unknown time   Musculoskeletal: Strength & Muscle Tone: within normal limits Gait & Station: normal Patient leans: N/A  Psychiatric Specialty Exam: Physical Exam blood pressure up despite [facility] detox underway  Review of Systems  Constitutional: Negative.   HENT: Negative.   Eyes: Negative.   Respiratory: Negative.   Cardiovascular: Negative.   Gastrointestinal: Positive for nausea. Negative for abdominal pain, blood in stool, constipation, diarrhea, heartburn, melena and vomiting.  Genitourinary: Negative.   Musculoskeletal: Positive for myalgias. Negative for back pain, falls, joint pain and neck pain.  Skin: Negative.   Neurological: Negative.   Endo/Heme/Allergies: Negative.   Psychiatric/Behavioral: Positive for depression, substance abuse and suicidal ideas. Negative for hallucinations and memory loss. The patient has insomnia. The patient is not nervous/anxious.     Blood pressure (!) 151/112, pulse 93, temperature (!) 97.4 F (36.3 C), temperature source Oral, resp. rate 18, height '5\' 6"'  (1.676 m), weight 71.2 kg, SpO2 99 %.Body mass index is 25.34 kg/m.  General Appearance: Casual  Eye Contact:  Fair  Speech:  Clear and Coherent  Volume:  Decreased  Mood:  Anxious and Depressed  Affect:  Congruent  Thought Process:  Goal Directed  Orientation:  Full (Time, Place, and Person)  Thought Content:  Logical  Suicidal Thoughts: Yes without plans or intent here can contract  Homicidal Thoughts:  No  Memory:  Immediate;   Fair  Judgement:  Fair  Insight:  Fair  Psychomotor Activity:  Normal  Concentration:  Concentration: Fair  Recall:  Fair  Fund of Knowledge:  Fair  Language:  Fair  Akathisia:  Negative  Handed:  Right   AIMS (if indicated):     Assets:  Resilience Social Support  ADL's:  Intact  Cognition:  WNL  Sleep:  Number of Hours: 2.5      Observation Level/Precautions:  Detox  Laboratory:  UDS  Psychotherapy: Cognitive and rehab  Medications: Detox underway and antidepressant  Consultations: None necessary at present medically cleared already  Discharge Concerns: Long-term sobriety  Estimated LOS: 5-7  Other: Axis I opiate dependence and withdrawal/depression recurrent severe/PTSD/no psychosis   Physician Treatment Plan for Primary Diagnosis: <principal problem not specified> Long Term Goal(s): Improvement in symptoms so as ready for discharge  Short Term Goals: Ability to maintain clinical measurements within normal limits will improve  Physician Treatment Plan for Secondary Diagnosis: Active Problems:   MDD (major depressive disorder), recurrent severe, without psychosis ([place])  Long Term Goal(s): Improvement in symptoms so as ready for discharge  Short Term Goals: Ability to demonstrate self-control will improve  I certify that inpatient services furnished can reasonably be expected to improve the patient's condition.    Johnn Hai, MD 5/15/20209:36 AM

## 2018-11-25 NOTE — [facility] Suicide Risk Assessment (Signed)
[facility] Admission Suicide Risk Assessment   Nursing information obtained from:  Patient, Review of record Demographic factors:  Male Current Mental Status:  Suicidal ideation indicated by patient, Suicide plan, Self-harm thoughts Loss Factors:  NA Historical Factors:  NA Risk Reduction Factors:  Sense of responsibility to family  Total Time spent with patient: 45 minutes Principal Problem: Heroin dependency/suicidal thoughts Diagnosis:  Active Problems:   MDD (major depressive disorder), recurrent severe, without psychosis ([facility])  Subjective Data: Patient complaining of suicidal thoughts-opiate dependency/history of PTSD  Continued Clinical Symptoms:  Alcohol Use Disorder Identification Test Final Score (AUDIT): 0 The "Alcohol Use Disorders Identification Test", Guidelines for Use in Primary Care, Second Edition.  World Science writer [facility]). Score between 0-7:  no or low risk or alcohol related problems. Score between 8-15:  moderate risk of alcohol related problems. Score between 16-19:  high risk of alcohol related problems. Score 20 or above:  warrants further diagnostic evaluation for alcohol dependence and treatment.   CLINICAL FACTORS:   Alcohol/Substance Abuse/Dependencies   Musculoskeletal: Strength & Muscle Tone: within normal limits Gait & Station: normal Patient leans: N/A  Psychiatric Specialty Exam: Physical Exam blood pressure up despite [facility] detox underway  Review of Systems  Constitutional: Negative.   HENT: Negative.   Eyes: Negative.   Respiratory: Negative.   Cardiovascular: Negative.   Gastrointestinal: Positive for nausea. Negative for abdominal pain, blood in stool, constipation, diarrhea, heartburn, melena and vomiting.  Genitourinary: Negative.   Musculoskeletal: Positive for myalgias. Negative for back pain, falls, joint pain and neck pain.  Skin: Negative.   Neurological: Negative.   Endo/Heme/Allergies: Negative.    Psychiatric/Behavioral: Positive for depression, substance abuse and suicidal ideas. Negative for hallucinations and memory loss. The patient has insomnia. The patient is not nervous/anxious.     Blood pressure (!) 151/112, pulse 93, temperature (!) 97.4 F (36.3 C), temperature source Oral, resp. rate 18, height 5\' 6"  (1.676 m), weight 71.2 kg, SpO2 99 %.Body mass index is 25.34 kg/m.  General Appearance: Casual  Eye Contact:  Fair  Speech:  Clear and Coherent  Volume:  Decreased  Mood:  Anxious and Depressed  Affect:  Congruent  Thought Process:  Goal Directed  Orientation:  Full (Time, Place, and Person)  Thought Content:  Logical  Suicidal Thoughts: Yes without plans or intent here can contract  Homicidal Thoughts:  No  Memory:  Immediate;   Fair  Judgement:  Fair  Insight:  Fair  Psychomotor Activity:  Normal  Concentration:  Concentration: Fair  Recall:  [employer] of Knowledge:  Fair  Language:  Fair  Akathisia:  Negative  Handed:  Right  AIMS (if indicated):     Assets:  Resilience Social Support  ADL's:  Intact  Cognition:  WNL  Sleep:  Number of Hours: 2.5      COGNITIVE FEATURES THAT CONTRIBUTE TO RISK:  None    SUICIDE RISK: Moderate- endorsing thoughts without plans or intent/comorbid PTSD and substance abuse PLAN OF CARE: Detox at antidepressant regimen  I certify that inpatient services furnished can reasonably be expected to improve the patient's condition.   Malvin Johns, MD 11/25/2018, 9:32 AM

## 2018-11-25 NOTE — Progress Notes (Signed)
Recreation Therapy Notes  Date:  5.15.20 Time: 0930 Location: [address] Dayroom  Group Topic: Stress Management  Goal Area(s) Addresses:  Patient will identify positive stress management techniques. Patient will identify benefits of using stress management post d/c.  Intervention: Stress Management  Activity :  Guided Imagery.  LRT introduced the stress management technique of guided imagery.  LRT read [place] script that lead patients on [place] walk along the beach.  Patients were to listen and follow along to engage in activity.  Education:  Stress Management, Discharge Planning.   Education Outcome: Acknowledges Education  Clinical Observations/Feedback: Pt did not attend group.    Dilana Mcphie, LRT/CTRS        Dave Moran 11/25/2018 11:54 AM 

## 2018-11-25 NOTE — Progress Notes (Signed)
Inpatient Diabetes Program Recommendations  AACE/ADA: New Consensus Statement on Inpatient Glycemic Control (2015)  Target Ranges:  Prepandial:   less than 140 mg/dL      Peak postprandial:   less than 180 mg/dL (1-2 hours)      Critically ill patients:  140 - 180 mg/dL   Lab Results  Component Value Date   GLUCAP 244 (H) 11/25/2018   HGBA1C 10.6 (H) 07/10/2018    Review of Glycemic Control  CBG this am - 244 mg/dL Would benefit from adding Lantus for basal insulin  Inpatient Diabetes Program Recommendations:     Add Lantus 10 units QD Increase Novolog to 0-15 units tidwc and hs Need updated HgbA1C.  Pt will need to f/u with PCP for diabetes management when discharged.  Continue to follow.   Thank you. Ailene Ards, RD, LDN, CDE Inpatient Diabetes Coordinator [phone]

## 2018-11-26 MED ORDER — LOSARTAN POTASSIUM 50 MG PO TABS
50.0000 mg | ORAL_TABLET | Freq: Every day | ORAL | Status: DC
  Filled 2018-11-26: qty 1

## 2018-11-26 MED ORDER — TRAZODONE HCL 100 MG PO TABS
100.0000 mg | ORAL_TABLET | Freq: Every evening | ORAL | Status: DC | PRN
  Administered 2018-11-26 – 2018-11-28 (×3): 100 mg via ORAL
  Filled 2018-11-26 (×3): qty 1

## 2018-11-26 MED ORDER — PANTOPRAZOLE SODIUM 40 MG PO TBEC
40.0000 mg | DELAYED_RELEASE_TABLET | Freq: Every day | ORAL | Status: DC
  Administered 2018-11-26 – 2018-11-29 (×4): 40 mg via ORAL
  Filled 2018-11-26 (×7): qty 1

## 2018-11-26 NOTE — [facility] Group Notes (Addendum)
[facility] Group Notes:  (Nursing)  Date:  11/26/2018  Time: 130 PM Type of Therapy:  Nurse Education  Participation Level:  Active  Participation Quality:  Appropriate  Affect:  Appropriate  Cognitive:  Alert  Insight:  Appropriate  Engagement in Group:  Engaged  Modes of Intervention:  Education  Summary of Progress/Problems: Nurse led Life Skills Group  Shela Nevin 11/26/2018, 1:59 PM

## 2018-11-26 NOTE — [facility] Group Notes (Signed)
[facility] LCSW Group Therapy Note  11/26/2018  10:00-11:00AM  Type of Therapy and Topic:  Group Therapy - Accepting We Are All Damaged People  Participation Level:  Active   Description of Group:  Patients in this group were asked to share whether they feel that they are "damaged" and explain their responses.  A song entitled "Damaged People" was then played, followed by a discussion of the relevance/relatedness of this song to each patient.   The conclusion of the group was that our goal as humans does not need to be perfection, but rather growth.  Insights among group members were shared, including that it is easy to point the fingers at others as being damaged, but actually we need to realize that we also are flawed humans with problems to overcome.  The group concluded with an emphasis on how this is ultimately a message of hope that we face struggles like every other person in the world, and that we are not alone.  Therapeutic Goals: 1)  introduce the concept of pain and hardship being universal  2)  connect emotionally to a musical message and to other group members  3)  identify the patient's current beliefs about their own broken methods of resolving their life problems to date, specifically related to this hospitalization  4)  allow time and space for patients to vent their pain and receive support from other patients  5)  elicit hope that arises from realizing we are not alone in our human struggles   Summary of Patient Progress:  The patient expressed that he does feel he is "damaged," because he was in the [employer]	[place] and saw traumatic things for 5 years that bothered him and he couldn't talk about them.  As a result, he started using drugs, but then got off them.  He was sober for 5 years but just had a relapse recently he stated.  He related to the topic and to other group members well.  His affect was somewhat depressed but his mood was upbeat.  Therapeutic Modalities:   Motivational  Interviewing Activity  Lynnell Chad  11/26/2018 8:36 AM

## 2018-11-26 NOTE — Progress Notes (Signed)
D.  Pt pleasant on approach, no complaints voiced.  Pt remained in bed during group time, but did get up later and was observed interacting appropriately with peers on the unit.  Pt denies SI/HI/AVH at this time.  A.  Support and encouragement offered, medication given as ordered  R.  Pt remains safe on the unit, will continue to monitor.

## 2018-11-26 NOTE — Progress Notes (Signed)
D Pt is observed OOB UAL on the 300 hall today. HE is less flat and depressed. He articulates his thoughts to this RN well- he is pleasant, cooperative, he asks appropriate questioons about his poc and he rpocesses well. He wears his own clothes, he is well groo,ed.     A HE completed his daily assessment and on this he wrote he denied SI today and he rated his depression, hopelessness and anxiety " 6/5/8", respectively. He is started on po protnoix, due to complaints of gastric upset and his HS trazadone is increased by MD , due to complaints of continued poor sleep. These meds changes were explained to him by this Clinical research associate and he states verbal understanding.     R Safety in place.
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
## 2018-11-26 NOTE — Progress Notes (Signed)
Psychoeducational Group Note  Date:  11/26/2018 Time:  2030  Group Topic/Focus:  wrap up group  Participation Level: Did Not Attend  Participation Quality:  Not Applicable  Affect:  Not Applicable  Cognitive:  Not Applicable  Insight:  Not Applicable  Engagement in Group: Not Applicable  Additional Comments:  Pt was asleep during group time.   Marcille Buffy 11/26/2018, 9:25 PM

## 2018-11-26 NOTE — Progress Notes (Addendum)
[facility] MD Progress Note  11/26/2018 2:44 PM Dave Moran  MRN:  [id number]   Subjective: Patient reports today that he feels that he is improving some.  He states he has really has no complaints today except for having some abdominal pain that he has had for years and that seems to have flared up today.  Patient denies any withdrawal symptoms.  Patient denies any medication side effects today.  Patient does report having some diarrhea but has also dealt with constipation and has had to go to the hospital twice in the past over constipation.  Patient reports that he has been followed by Dr.  in the past and had a colonoscopy 6 years ago and nothing was reported to be abnormal.  Patient reports that he had some abnormal sleep last night and was not sleeping well.  Patient does report having improved appetite though.  Patient reports still having some depression and anxiety and reports his depression at a 6 out of 10 and his anxiety at a 7 out of 10 with 10 being the worst.  Patient was concerned about his blood pressure and reports that when he was at the [place] he was prescribed lisinopril, HCTZ, and amlodipine.  He reports that he has been taking his Cozaar as prescribed and was curious why have been stopped.  Objective: Patient's chart and findings reviewed and discussed with treatment team.  Patient presents in his room lying in the bed but is awake and oriented.  Patient has been seen in the day room interacting with peers and staff appropriately and has been attending groups.  Informed patient that some of his diarrhea and symptoms of anxiety may be due to his opiate withdrawals and patient stated understanding and agreement.  Reviewed patient's blood pressure medications and discussed with patient that he is on a clonidine detox protocol for his opiate use and that he could decrease his blood pressure too much and that will be monitored.  I have requested the nursing staff to monitor patient's blood pressure  before and after clonidine use to ensure that blood pressure is remaining in a normal level.  Due to patient complaint of disrupted sleep will increase trazodone to 100 mg p.o. nightly as needed.  Principal Problem: MDD (major depressive disorder), recurrent severe, without psychosis (HCC) Diagnosis: Principal Problem:   MDD (major depressive disorder), recurrent severe, without psychosis (HCC) Active Problems:   Opioid use with opioid-induced mood disorder (HCC)  Total Time spent with patient: 30 minutes  Past Psychiatric History: See H&P  Past Medical History:  Past Medical History:  Diagnosis Date  . Chest pain 07/09/2018  . Diabetes mellitus type 2, noninsulin dependent (HCC)   . Essential hypertension   . Hypertensive urgency   . Hypokalemia     Past Surgical History:  Procedure Laterality Date  . CHOLECYSTECTOMY     Family History: History reviewed. No pertinent family history. Family Psychiatric  History: See H&P Social History:  Social History   Substance and Sexual Activity  Alcohol Use Yes     Social History   Substance and Sexual Activity  Drug Use Never    Social History   Socioeconomic History  . Marital status: Significant Other    Spouse name: Not on file  . Number of children: Not on file  . Years of education: Not on file  . Highest education level: Not on file  Occupational History  . Not on file  Social Needs  . Financial resource strain: Not  on file  . Food insecurity:    Worry: Not on file    Inability: Not on file  . Transportation needs:    Medical: Not on file    Non-medical: Not on file  Tobacco Use  . Smoking status: Current Every Day Smoker    Types: Cigarettes  . Smokeless tobacco: Never Used  Substance and Sexual Activity  . Alcohol use: Yes  . Drug use: Never  . Sexual activity: Not on file  Lifestyle  . Physical activity:    Days per week: Not on file    Minutes per session: Not on file  . Stress: Not on file   Relationships  . Social connections:    Talks on phone: Not on file    Gets together: Not on file    Attends religious service: Not on file    Active member of club or organization: Not on file    Attends meetings of clubs or organizations: Not on file    Relationship status: Not on file  Other Topics Concern  . Not on file  Social History Narrative  . Not on file   Additional Social History:                         Sleep: Fair  Appetite:  Good  Current Medications: Current Facility-Administered Medications  Medication Dose Route Frequency Provider Last Rate Last Dose  . acetaminophen (TYLENOL) tablet 650 mg  650 mg Oral Q6H PRN Laveda Abbe, NP   650 mg at 11/25/18 0158  . alum & mag hydroxide-simeth (MAALOX/MYLANTA) 200-200-20 MG/5ML suspension 30 mL  30 mL Oral Q4H PRN Laveda Abbe, NP      . atorvastatin (LIPITOR) tablet 40 mg  40 mg Oral q1800 Laveda Abbe, NP   40 mg at 11/25/18 1846  . cloNIDine (CATAPRES) tablet 0.1 mg  0.1 mg Oral QID Donell Sievert E, PA-C   0.1 mg at 11/26/18 1309   Followed by  . [START ON 11/27/2018] cloNIDine (CATAPRES) tablet 0.1 mg  0.1 mg Oral BH-qamhs Simon, Spencer E, PA-C       Followed by  . [START ON 11/30/2018] cloNIDine (CATAPRES) tablet 0.1 mg  0.1 mg Oral QAC breakfast Donell Sievert E, PA-C      . dicyclomine (BENTYL) tablet 20 mg  20 mg Oral Q6H PRN Donell Sievert E, PA-C   20 mg at 11/26/18 0836  . FLUoxetine (PROZAC) capsule 20 mg  20 mg Oral Daily Malvin Johns, MD   20 mg at 11/26/18 0837  . gabapentin (NEURONTIN) capsule 300 mg  300 mg Oral TID Malvin Johns, MD   300 mg at 11/26/18 1309  . glipiZIDE (GLUCOTROL) tablet 5 mg  5 mg Oral Q breakfast Laveda Abbe, NP   5 mg at 11/26/18 0981  . hydrOXYzine (ATARAX/VISTARIL) tablet 25 mg  25 mg Oral Q6H PRN Kerry Hough, PA-C   25 mg at 11/25/18 2133  . loperamide (IMODIUM) capsule 2-4 mg  2-4 mg Oral PRN Donell Sievert E, PA-C      .  magnesium hydroxide (MILK OF MAGNESIA) suspension 30 mL  30 mL Oral Daily PRN Laveda Abbe, NP      . metFORMIN (GLUCOPHAGE) tablet 500 mg  500 mg Oral BID WC Laveda Abbe, NP   500 mg at 11/26/18 0837  . methocarbamol (ROBAXIN) tablet 750 mg  750 mg Oral TID Malvin Johns, MD   750  mg at 11/26/18 1308  . naproxen (NAPROSYN) tablet 500 mg  500 mg Oral BID PRN Kerry HoughSimon, Spencer E, PA-C   500 mg at 11/26/18 0150  . nicotine (NICODERM CQ - dosed in mg/24 hours) patch 21 mg  21 mg Transdermal Daily Cobos, Rockey SituFernando A, MD   21 mg at 11/26/18 0837  . ondansetron (ZOFRAN-ODT) disintegrating tablet 4 mg  4 mg Oral Q6H PRN Donell SievertSimon, Spencer E, PA-C      . pantoprazole (PROTONIX) EC tablet 40 mg  40 mg Oral Daily Money, Travis B, FNP   40 mg at 11/26/18 0840  . prazosin (MINIPRESS) capsule 4 mg  4 mg Oral QHS Malvin JohnsFarah, Brian, MD   4 mg at 11/25/18 2133  . propranolol (INDERAL) tablet 20 mg  20 mg Oral TID Malvin JohnsFarah, Brian, MD   20 mg at 11/26/18 1309  . traZODone (DESYREL) tablet 100 mg  100 mg Oral QHS PRN Money, Gerlene Burdockravis B, FNP        Lab Results:  Results for orders placed or performed during the hospital encounter of 11/24/18 (from the past 48 hour(s))  Glucose, capillary     Status: Abnormal   Collection Time: 11/25/18  5:50 AM  Result Value Ref Range   Glucose-Capillary 244 (H) 70 - 99 mg/dL   Comment 1 Notify RN     Blood Alcohol level:  Lab Results  Component Value Date   ETH <10 11/23/2018    Metabolic Disorder Labs: Lab Results  Component Value Date   HGBA1C 10.6 (H) 07/10/2018   MPG 257.52 07/10/2018   No results found for: PROLACTIN Lab Results  Component Value Date   CHOL 183 07/10/2018   TRIG 139 07/10/2018   HDL 54 07/10/2018   CHOLHDL 3.4 07/10/2018   VLDL 28 07/10/2018   LDLCALC 101 (H) 07/10/2018    Physical Findings: AIMS:  , ,  ,  ,    CIWA:    COWS:  COWS Total Score: 2  Musculoskeletal: Strength & Muscle Tone: within normal limits Gait & Station:  normal Patient leans: N/A  Psychiatric Specialty Exam: Physical Exam  Nursing note and vitals reviewed. Constitutional: He is oriented to person, place, and time. He appears well-developed and well-nourished.  Cardiovascular: Normal rate.  Respiratory: Effort normal.  Musculoskeletal: Normal range of motion.  Neurological: He is alert and oriented to person, place, and time.  Skin: Skin is warm.    Review of Systems  Constitutional: Negative.   HENT: Negative.   Eyes: Negative.   Respiratory: Negative.   Cardiovascular: Negative.   Gastrointestinal: Positive for abdominal pain.  Genitourinary: Negative.   Musculoskeletal: Negative.   Skin: Negative.   Neurological: Negative.   Endo/Heme/Allergies: Negative.   Psychiatric/Behavioral: Positive for depression and substance abuse. Negative for hallucinations and suicidal ideas. The patient is nervous/anxious and has insomnia.     Blood pressure (!) 147/95, pulse 81, temperature 98.8 F (37.1 C), resp. rate 18, height 5\' 6"  (1.676 m), weight 71.2 kg, SpO2 99 %.Body mass index is 25.34 kg/m.  General Appearance: Casual  Eye Contact:  Good  Speech:  Clear and Coherent and Normal Rate  Volume:  Normal  Mood:  Anxious and Depressed  Affect:  Congruent  Thought Process:  Coherent and Descriptions of Associations: Intact  Orientation:  Full (Time, Place, and Person)  Thought Content:  WDL  Suicidal Thoughts:  No  Homicidal Thoughts:  No  Memory:  Immediate;   Good Recent;   Good Remote;  Good  Judgement:  Fair  Insight:  Fair  Psychomotor Activity:  Normal  Concentration:  Concentration: Good and Attention Span: Good  Recall:  Good  Fund of Knowledge:  Good  Language:  Good  Akathisia:  No  Handed:  Right  AIMS (if indicated):     Assets:  Communication Skills Desire for Improvement Housing Physical Health Resilience Transportation  ADL's:  Intact  Cognition:  WNL  Sleep:  Number of Hours: 3.25   Problems  addressed MDD severe recurrent Opioid use  Treatment Plan Summary: Daily contact with patient to assess and evaluate symptoms and progress in treatment, Medication management and Plan is to: Continue clonidine detox protocol for opiate abuse Encourage patient to use Bentyl 20 mg p.o. every 6 hours as needed for abdominal cramping Continue Prozac 20 mg p.o. daily for MDD Continue Neurontin 300 mg p.o. 3 times daily for withdrawal symptoms Continue Vistaril 25 mg p.o. every 6 hours PRN for anxiety Continue prazosin 4 mg p.o. nightly for nightmares Increase trazodone to 100 mg p.o. nightly as needed for insomnia Encourage group therapy participation Continue to 15-minute safety checks CSW to assist with disposition planning  Maryfrances Bunnell, FNP 11/26/2018, 2:44 PM   Attest to NP progress note

## 2018-11-26 NOTE — Plan of Care (Signed)
  Problem: Education: Goal: Emotional status will improve Outcome: Progressing   

## 2018-11-27 DIAGNOSIS — K5900 Constipation, unspecified: Secondary | ICD-10-CM | POA: Clinically undetermined

## 2018-11-27 LAB — GLUCOSE, CAPILLARY: Glucose-Capillary: 225 mg/dL — ABNORMAL HIGH (ref 70–99)

## 2018-11-27 MED ORDER — POLYETHYLENE GLYCOL 3350 17 G PO PACK
17.0000 g | PACK | Freq: Every day | ORAL | Status: DC | PRN
  Administered 2018-11-27 – 2018-11-28 (×2): 17 g via ORAL
  Filled 2018-11-27 (×2): qty 1

## 2018-11-27 MED ORDER — LINACLOTIDE 290 MCG PO CAPS
290.0000 ug | ORAL_CAPSULE | Freq: Once | ORAL | Status: AC
  Administered 2018-11-27: 290 ug via ORAL
  Filled 2018-11-27: qty 1

## 2018-11-27 MED ORDER — LOSARTAN POTASSIUM 50 MG PO TABS
50.0000 mg | ORAL_TABLET | Freq: Every day | ORAL | Status: DC
  Administered 2018-11-27 – 2018-11-29 (×3): 50 mg via ORAL
  Filled 2018-11-27 (×7): qty 1

## 2018-11-27 NOTE — Progress Notes (Signed)
Pt said his day was a 2. The one positive thing that happen to him today he ate more food today.

## 2018-11-27 NOTE — [facility] Suicide Risk Assessment (Signed)
[facility] INPATIENT:  Family/Significant Other Suicide Prevention Education  Suicide Prevention Education:  Education Completed;  Gaynelle Arabian [phone] or [phone],  (name of family member/significant other) has been identified by the patient as the family member/significant other with whom the patient will be residing, and identified as the person(s) who will aid the patient in the event of a mental health crisis (suicidal ideations/suicide attempt).  With written consent from the patient, the family member/significant other has been provided the following suicide prevention education, prior to the and/or following the discharge of the patient.  Steffanie Rainwater stated that she is concerned to hear that patient will likely be discharged tomorrow, because he is not receiving anything for his pain that she knows of and that is what will drive him back to using drugs.  If he is discharged without this matter addressed, she believes he will relapse.  She would like to speak to doctor or nurse, please.  The suicide prevention education provided includes the following:  Suicide risk factors  Suicide prevention and interventions  National Suicide Hotline telephone number  [facility] assessment telephone number  [facility] Emergency Assistance 911  [facility] and/or Residential Mobile Crisis Unit telephone number  Request made of family/significant other to:  Remove weapons (e.g., guns, rifles, knives), all items previously/currently identified as safety concern.    Remove drugs/medications (over-the-counter, prescriptions, illicit drugs), all items previously/currently identified as a safety concern.  The family member/significant other verbalizes understanding of the suicide prevention education information provided.  The family member/significant other agrees to remove the items of safety concern listed above.  Carloyn Jaeger Grossman-Orr 11/27/2018, 5:09 PM

## 2018-11-27 NOTE — [facility] Group Notes (Signed)
[facility] LCSW Group Therapy Note  11/27/2018  10:00-11:00AM  Type of Therapy and Topic:  Group Therapy:  Adding Supports Including Yourself  Participation Level:  Did Not Attend   Description of Group:  Patients in this group were introduced to the concept that additional supports including self-support are an essential part of recovery.  Patients listed what supports they believe they need to add to their lives to achieve their goals at discharge, and they listed such things as therapist, family, doctor, support groups, and service dog.  CSW described the  continuum of mental health/substance abuse services available and the group discussed the differences among these including support group, therapy group, 12-step group, Assertive Community Treatment Team, Peer Support Specialist, and such.  A song entitled "My Own Hero" was played and a group discussion ensued in which patients stated they could relate to the song and it inspired them to realize they have be willing to help themselves in order to succeed, because other people cannot achieve sobriety or stability for them.  A song was played called "I Am Enough" which led to a discussion about being willing to believe we are worth the effort of being a self-support.  Group members expressed appreciation for each other.  Therapeutic Goals: 1)  demonstrate the importance of being a key part of one's own support system 2)  discuss various available supports 3)  encourage patient to use music as part of their self-support and focus on goals 4)  elicit ideas from patients about supports that need to be added   Summary of Patient Progress:  N/A   Therapeutic Modalities:   Motivational Interviewing Activity  Janete Quilling J Grossman-Orr       

## 2018-11-27 NOTE — Plan of Care (Signed)
  Problem: Education: Goal: Knowledge of [facility] Education information/materials will improve Outcome: Progressing Goal: Emotional status will improve Outcome: Progressing Goal: Mental status will improve Outcome: Progressing   

## 2018-11-27 NOTE — Progress Notes (Signed)
[place] NOVEL CORONAVIRUS (COVID-19) DAILY CHECK-OFF SYMPTOMS - answer yes or no to each - every day NO YES  Have you had a fever in the past 24 hours?  . Fever (Temp > 37.80C / 100F) X   Have you had any of these symptoms in the past 24 hours? . New Cough .  Sore Throat  .  Shortness of Breath .  Difficulty Breathing .  Unexplained Body Aches   X   Have you had any one of these symptoms in the past 24 hours not related to allergies?   . Runny Nose .  Nasal Congestion .  Sneezing   X   If you have had runny nose, nasal congestion, sneezing in the past 24 hours, has it worsened?  X   EXPOSURES - check yes or no X   Have you traveled outside the state in the past 14 days?  X   Have you been in contact with someone with a confirmed diagnosis of COVID-19 or PUI in the past 14 days without wearing appropriate PPE?  X   Have you been living in the same home as a person with confirmed diagnosis of COVID-19 or a PUI (household contact)?    X   Have you been diagnosed with COVID-19?    X              What to do next: Answered NO to all: Answered YES to anything:   Proceed with unit schedule Follow the [facility] Inpatient Flowsheet.   

## 2018-11-27 NOTE — Progress Notes (Signed)
D Patient started off the day with complaints of discomfort related to his constipation. He complained of stomach cramping, bad gas pains that radiated his abdomen ( front to back) and he took per MD order Linzess ( for IBS per NP TM). He reported tothis Clinical research associate " my bowels finally moved and I feel much better" today after lunch. He remains unshaven, disheveled and flat and anxious.     A HE completed his daily assessment and on this he wrote he denied SI today and he rated his depression, hopelessness and anxiety " 5/3/7", respectively. He did not attend his Life SKills group this afternoon.     R Safety is in place.

## 2018-11-27 NOTE — Progress Notes (Addendum)
[facility] MD Progress Note  11/27/2018 2:07 PM [facility]  MRN:  [id number]   Subjective: Patient reports today that mentally he is doing quite well.  He denies any suicidal or homicidal ideations and denies any hallucinations.  Patient states that he feels that he is improving significantly.  He denies any withdrawal symptoms except for having some abdominal pain.  He is reports that he has a history of constipation and has had to go to the [facility] a couple times in the past.  He reports that he has been giving up to 64 ounces of MiraLAX to help pass some stool.  Patient states that he has not had a bowel movement at all in about 3 days.  Patient reports that he has been taking the Bentyl that is prescribed and he states that it is helping some.  However, he states that he does feel that he needs to have a good bowel movement.  Objective: Patient's chart and findings reviewed and discussed with treatment team.  Patient is pleasant, calm, and cooperative.  Patient has been seen interacting with peers and staff appropriately and has been attending group and participating.  Due to patient's reports of his constipation and go to [facility] a couple times and prescribed the patient a one-time dose of Linzess 290 mcg p.o. and I have prescribed the patient MiraLAX as needed daily.  Principal Problem: MDD (major depressive disorder), recurrent severe, without psychosis (HCC) Diagnosis: Principal Problem:   MDD (major depressive disorder), recurrent severe, without psychosis (HCC) Active Problems:   Opioid use with opioid-induced mood disorder (HCC)  Total Time spent with patient: 30 minutes  Past Psychiatric History: See H&P  Past Medical History:  Past Medical History:  Diagnosis Date  . Chest pain 07/09/2018  . Diabetes mellitus type 2, noninsulin dependent (HCC)   . Essential hypertension   . Hypertensive urgency   . Hypokalemia     Past Surgical History:  Procedure Laterality Date  .  CHOLECYSTECTOMY     Family History: History reviewed. No pertinent family history. Family Psychiatric  History: See H&P Social History:  Social History   Substance and Sexual Activity  Alcohol Use Yes     Social History   Substance and Sexual Activity  Drug Use Never    Social History   Socioeconomic History  . Marital status: Significant Other    Spouse name: Not on file  . Number of children: Not on file  . Years of education: Not on file  . Highest education level: Not on file  Occupational History  . Not on file  Social Needs  . Financial resource strain: Not on file  . Food insecurity:    Worry: Not on file    Inability: Not on file  . Transportation needs:    Medical: Not on file    Non-medical: Not on file  Tobacco Use  . Smoking status: Current Every Day Smoker    Types: Cigarettes  . Smokeless tobacco: Never Used  Substance and Sexual Activity  . Alcohol use: Yes  . Drug use: Never  . Sexual activity: Not on file  Lifestyle  . Physical activity:    Days per week: Not on file    Minutes per session: Not on file  . Stress: Not on file  Relationships  . Social connections:    Talks on phone: Not on file    Gets together: Not on file    Attends religious service: Not on file  Active member of club or organization: Not on file    Attends meetings of clubs or organizations: Not on file    Relationship status: Not on file  Other Topics Concern  . Not on file  Social History Narrative  . Not on file   Additional Social History:                         Sleep: Good  Appetite:  Good  Current Medications: Current Facility-Administered Medications  Medication Dose Route Frequency Provider Last Rate Last Dose  . acetaminophen (TYLENOL) tablet 650 mg  650 mg Oral Q6H PRN Laveda Abbe, NP   650 mg at 11/25/18 0158  . alum & mag hydroxide-simeth (MAALOX/MYLANTA) 200-200-20 MG/5ML suspension 30 mL  30 mL Oral Q4H PRN Laveda Abbe, NP      . atorvastatin (LIPITOR) tablet 40 mg  40 mg Oral q1800 Laveda Abbe, NP   40 mg at 11/26/18 1727  . cloNIDine (CATAPRES) tablet 0.1 mg  0.1 mg Oral BH-qamhs Simon, Spencer E, PA-C       Followed by  . [START ON 11/30/2018] cloNIDine (CATAPRES) tablet 0.1 mg  0.1 mg Oral QAC breakfast Donell Sievert E, PA-C      . dicyclomine (BENTYL) tablet 20 mg  20 mg Oral Q6H PRN Donell Sievert E, PA-C   20 mg at 11/27/18 0601  . FLUoxetine (PROZAC) capsule 20 mg  20 mg Oral Daily Malvin Johns, MD   20 mg at 11/27/18 7209  . gabapentin (NEURONTIN) capsule 300 mg  300 mg Oral TID Malvin Johns, MD   300 mg at 11/27/18 1300  . glipiZIDE (GLUCOTROL) tablet 5 mg  5 mg Oral Q breakfast Laveda Abbe, NP   5 mg at 11/27/18 4709  . hydrOXYzine (ATARAX/VISTARIL) tablet 25 mg  25 mg Oral Q6H PRN Kerry Hough, PA-C   25 mg at 11/27/18 0600  . loperamide (IMODIUM) capsule 2-4 mg  2-4 mg Oral PRN Donell Sievert E, PA-C      . magnesium hydroxide (MILK OF MAGNESIA) suspension 30 mL  30 mL Oral Daily PRN Laveda Abbe, NP   30 mL at 11/27/18 0823  . metFORMIN (GLUCOPHAGE) tablet 500 mg  500 mg Oral BID WC Laveda Abbe, NP   500 mg at 11/27/18 6283  . methocarbamol (ROBAXIN) tablet 750 mg  750 mg Oral TID Malvin Johns, MD   750 mg at 11/27/18 1300  . naproxen (NAPROSYN) tablet 500 mg  500 mg Oral BID PRN Donell Sievert E, PA-C   500 mg at 11/26/18 0150  . nicotine (NICODERM CQ - dosed in mg/24 hours) patch 21 mg  21 mg Transdermal Daily , Rockey Situ, MD   21 mg at 11/27/18 0828  . ondansetron (ZOFRAN-ODT) disintegrating tablet 4 mg  4 mg Oral Q6H PRN Donell Sievert E, PA-C      . pantoprazole (PROTONIX) EC tablet 40 mg  40 mg Oral Daily Money, Gerlene Burdock, FNP   40 mg at 11/27/18 6629  . polyethylene glycol (MIRALAX / GLYCOLAX) packet 17 g  17 g Oral Daily PRN Money, Feliz Beam B, FNP      . prazosin (MINIPRESS) capsule 4 mg  4 mg Oral QHS Malvin Johns, MD   4 mg at 11/26/18  2132  . propranolol (INDERAL) tablet 20 mg  20 mg Oral TID Malvin Johns, MD   20 mg at 11/27/18 1300  . traZODone (  DESYREL) tablet 100 mg  100 mg Oral QHS PRN Money, Gerlene Burdockravis B, FNP   100 mg at 11/26/18 2222    Lab Results:  Results for orders placed or performed during the [facility] encounter of 11/24/18 (from the past 48 hour(s))  Glucose, capillary     Status: Abnormal   Collection Time: 11/27/18  5:38 AM  Result Value Ref Range   Glucose-Capillary 225 (H) 70 - 99 mg/dL   Comment 1 Notify RN    Comment 2 Document in Chart     Blood Alcohol level:  Lab Results  Component Value Date   ETH <10 11/23/2018    Metabolic Disorder Labs: Lab Results  Component Value Date   HGBA1C 10.6 (H) 07/10/2018   MPG 257.52 07/10/2018   No results found for: PROLACTIN Lab Results  Component Value Date   CHOL 183 07/10/2018   TRIG 139 07/10/2018   HDL 54 07/10/2018   CHOLHDL 3.4 07/10/2018   VLDL 28 07/10/2018   LDLCALC 101 (H) 07/10/2018    Physical Findings: AIMS: Facial and Oral Movements Muscles of Facial Expression: None, normal Lips and Perioral Area: None, normal Jaw: None, normal Tongue: None, normal,Extremity Movements Upper (arms, wrists, hands, fingers): None, normal Lower (legs, knees, ankles, toes): None, normal, Trunk Movements Neck, shoulders, hips: None, normal, Overall Severity Severity of abnormal movements (highest score from questions above): None, normal Incapacitation due to abnormal movements: None, normal Patient's awareness of abnormal movements (rate only patient's report): No Awareness, Dental Status Current problems with teeth and/or dentures?: No Does patient usually wear dentures?: No  CIWA:    COWS:  COWS Total Score: 2  Musculoskeletal: Strength & Muscle Tone: within normal limits Gait & Station: normal Patient leans: N/A  Psychiatric Specialty Exam: Physical Exam  Nursing note and vitals reviewed. Constitutional: He is oriented to person,  place, and time. He appears well-developed and well-nourished.  Cardiovascular: Normal rate.  Respiratory: Effort normal.  Musculoskeletal: Normal range of motion.  Neurological: He is alert and oriented to person, place, and time.  Skin: Skin is warm.    Review of Systems  Constitutional: Negative.   HENT: Negative.   Eyes: Negative.   Respiratory: Negative.   Cardiovascular: Negative.   Gastrointestinal: Positive for abdominal pain and constipation.  Genitourinary: Negative.   Musculoskeletal: Negative.   Skin: Negative.   Neurological: Negative.   Endo/Heme/Allergies: Negative.   Psychiatric/Behavioral: Negative.     Blood pressure (!) 152/102, pulse 79, temperature (!) 97.5 F (36.4 C), temperature source Oral, resp. rate 16, height 5\' 6"  (1.676 m), weight 71.2 kg, SpO2 99 %.Body mass index is 25.34 kg/m.  General Appearance: Casual  Eye Contact:  Good  Speech:  Clear and Coherent and Normal Rate  Volume:  Normal  Mood:  Euthymic  Affect:  Congruent  Thought Process:  Coherent and Descriptions of Associations: Intact  Orientation:  Full (Time, Place, and Person)  Thought Content:  WDL  Suicidal Thoughts:  No  Homicidal Thoughts:  No  Memory:  Immediate;   Good Recent;   Good Remote;   Good  Judgement:  Good  Insight:  Good  Psychomotor Activity:  Normal  Concentration:  Concentration: Good and Attention Span: Good  Recall:  Good  Fund of Knowledge:  Good  Language:  Good  Akathisia:  No  Handed:  Right  AIMS (if indicated):     Assets:  Communication Skills Desire for Improvement Financial Resources/Insurance Housing Physical Health Resilience Social Support Transportation  ADL's:  Intact  Cognition:  WNL  Sleep:  Number of Hours: 3.5   Problems addressed MDD severe recurrent Opioid use was opioid induced mood disorder Constipation  Treatment Plan Summary: Daily contact with patient to assess and evaluate symptoms and progress in treatment,  Medication management and Plan is to: Continue clonidine detox protocol Continue Prozac 20 mg p.o. daily for MDD Continue Vistaril 25 mg p.o. every 6 hours as needed for anxiety Start Linzess 290 mcg one-time dose for constipation Start MiraLAX 17 g packet p.o. daily as needed for constipation Continue prazosin 4 mg p.o. nightly for nightmares Continue trazodone 100 mg p.o. nightly as needed for insomnia Encourage group therapy participation CSW to assist with disposition Continue every 15 minute safety checks  Maryfrances Bunnell, FNP 11/27/2018, 2:07 PM  Attest to NP progress note

## 2018-11-28 LAB — GLUCOSE, CAPILLARY: Glucose-Capillary: 223 mg/dL — ABNORMAL HIGH (ref 70–99)

## 2018-11-28 MED ORDER — FAMOTIDINE 20 MG PO TABS
20.0000 mg | ORAL_TABLET | Freq: Two times a day (BID) | ORAL | Status: DC
  Administered 2018-11-28 – 2018-11-29 (×3): 20 mg via ORAL
  Filled 2018-11-28 (×7): qty 1

## 2018-11-28 MED ORDER — PROPRANOLOL HCL 40 MG PO TABS
40.0000 mg | ORAL_TABLET | Freq: Three times a day (TID) | ORAL | Status: DC
  Administered 2018-11-28 – 2018-11-29 (×4): 40 mg via ORAL
  Filled 2018-11-28 (×9): qty 1

## 2018-11-28 MED ORDER — BISACODYL 5 MG PO TBEC
5.0000 mg | DELAYED_RELEASE_TABLET | Freq: Every day | ORAL | Status: DC | PRN
  Administered 2018-11-28: 17:00:00 5 mg via ORAL
  Filled 2018-11-28: qty 1

## 2018-11-28 NOTE — Progress Notes (Signed)
Recreation Therapy Notes  Date:  1.22.20 Time: 0930 Location: [address] Dayroom  Group Topic: Stress Management  Goal Area(s) Addresses:  Patient will identify positive stress management techniques. Patient will identify benefits of using stress management post d/c.  Behavioral Response:  Engaged  Intervention: Stress Management  Activity :  Meditation.  LRT introduced the stress management technique of meditation.  LRT played a meditation that focused on making the most of your day.  Patients were to listen and follow as meditation played to engage in activity.  Education:  Stress Management, Discharge Planning.   Education Outcome: Acknowledges Education  Clinical Observations/Feedback: Pt attended and participated in group session.     Caroll Rancher, LRT/CTRS         Caroll Rancher A 11/28/2018 11:45 AM

## 2018-11-28 NOTE — Progress Notes (Addendum)
Pt attended spiritual care group on loss and grief facilitated by Wilkie Aye, MDiv.   Group goal: Support / education around grief.  Identifying grief patterns, feelings / responses to grief, identifying behaviors that may emerge from grief responses, identifying when one may call on an ally or coping skill.  Group Description:  Following introductions and group rules, group opened with psycho-social ed. Group members engaged in facilitated dialog around topic of loss, with particular support around experiences of loss in their lives. Group Identified types of loss (relationships / self / things) and identified patterns, circumstances, and changes that precipitate losses. Reflected on thoughts / feelings around loss, normalized grief responses, and recognized variety in grief experience.   Group engaged in visual explorer activity, identifying elements of grief journey as well as needs / ways of caring for themselves.  Group reflected on Worden's tasks of grief.  Group facilitation drew on brief cognitive behavioral, narrative, and Adlerian modalities   Patient progress: Present throughout group.  Engaged in group conversation voluntarily.  Expressed support for other group members. Described moral injury around particluar experience in [employer].  Noted he is motivated to connect with other veterans through [place] to find support around grief and moral injury

## 2018-11-28 NOTE — Progress Notes (Signed)
Writer has observed patient up in the dayroom watching tv with minimal interaction with peers. He did c/o his abdomen hurting and received a dose of bentyl and encouraged to drink plenty of water. He was compliant with his medications. Support given and safety maintained on unit with 15 min checks.

## 2018-11-28 NOTE — Progress Notes (Signed)
Inpatient Diabetes Program Recommendations  AACE/ADA: New Consensus Statement on Inpatient Glycemic Control (2015)  Target Ranges:  Prepandial:   less than 140 mg/dL      Peak postprandial:   less than 180 mg/dL (1-2 hours)      Critically ill patients:  140 - 180 mg/dL   Results for ASHTIN, CRAWMER (MRN [id number]) as of 11/28/2018 08:13  Ref. Range 11/25/2018 05:50 11/27/2018 05:38 11/28/2018 06:12  Glucose-Capillary Latest Ref Range: 70 - 99 mg/dL 389 (H) 373 (H) 428 (H)     Home DM Meds: Glipizide 5 mg Daily       Metformin 500 mg BID  Current Orders: Glipizide 5 mg Daily      Metformin 500 mg BID      MD- CBGs are being checked in the AM.  All are >200 mg/dl.  Please consider adding Novolog Sensitive Correction Scale/ SSI (0-9 units) TID AC + HS for inpatient hospital use      --Will follow patient during hospitalization--  Ambrose Finland RN, MSN, CDE Diabetes Coordinator Inpatient Glycemic Control Team Team Pager: [phone] (8a-5p)

## 2018-11-28 NOTE — Plan of Care (Signed)
  Problem: Education: Goal: Knowledge of [facility] Education information/materials will improve Outcome: Progressing Goal: Emotional status will improve Outcome: Progressing Goal: Mental status will improve Outcome: Progressing Goal: Verbalization of understanding the information provided will improve Outcome: Progressing   

## 2018-11-28 NOTE — Progress Notes (Signed)
[facility]  11/28/2018 9:40 AM Marcello MooresJose Yerkes  MRN:  [id number] Subjective:   Patient continues to have GI upset may or may not be related to withdrawal blood pressure still up he is requesting another day to be safe we will go ahead and accommodate that and adjust his GI meds escalate his antihypertensive so forth he will contract for safety so we will allow him to probably be discharged tomorrow if he improves and these measures listed but no thoughts of harming self or others Principal Problem: MDD (major depressive disorder), recurrent severe, without psychosis (HCC) Diagnosis: Principal Problem:   MDD (major depressive disorder), recurrent severe, without psychosis (HCC) Active Problems:   Opioid use with opioid-induced mood disorder (HCC)   Constipation  Total Time spent with patient: 20 minutes  Past Medical History:  Past Medical History:  Diagnosis Date  . Chest pain 07/09/2018  . Diabetes mellitus type 2, noninsulin dependent (HCC)   . Essential hypertension   . Hypertensive urgency   . Hypokalemia     Past Surgical History:  Procedure Laterality Date  . CHOLECYSTECTOMY     Family History: History reviewed. No pertinent family history. Family Psychiatric  History: neg Social History:  Social History   Substance and Sexual Activity  Alcohol Use Yes     Social History   Substance and Sexual Activity  Drug Use Never    Social History   Socioeconomic History  . Marital status: Significant Other    Spouse name: Not on file  . Number of children: Not on file  . Years of education: Not on file  . Highest education level: Not on file  Occupational History  . Not on file  Social Needs  . Financial resource strain: Not on file  . Food insecurity:    Worry: Not on file    Inability: Not on file  . Transportation needs:    Medical: Not on file    Non-medical: Not on file  Tobacco Use  . Smoking status: Current Every Day Smoker    Types: Cigarettes   . Smokeless tobacco: Never Used  Substance and Sexual Activity  . Alcohol use: Yes  . Drug use: Never  . Sexual activity: Not on file  Lifestyle  . Physical activity:    Days per week: Not on file    Minutes per session: Not on file  . Stress: Not on file  Relationships  . Social connections:    Talks on phone: Not on file    Gets together: Not on file    Attends religious service: Not on file    Active member of club or organization: Not on file    Attends meetings of clubs or organizations: Not on file    Relationship status: Not on file  Other Topics Concern  . Not on file  Social History Narrative  . Not on file   Additional Social History:                         Sleep: Good  Appetite:  Good  Current Medications: Current Facility-Administered Medications  Medication Dose Route Frequency Provider Last Rate Last Dose  . acetaminophen (TYLENOL) tablet 650 mg  650 mg Oral Q6H PRN Laveda AbbeParks, Laurie Britton, NP   650 mg at 11/28/18 0222  . alum & mag hydroxide-simeth (MAALOX/MYLANTA) 200-200-20 MG/5ML suspension 30 mL  30 mL Oral Q4H PRN Laveda AbbeParks, Laurie Britton, NP      .  atorvastatin (LIPITOR) tablet 40 mg  40 mg Oral q1800 Laveda Abbe, NP   40 mg at 11/27/18 1759  . cloNIDine (CATAPRES) tablet 0.1 mg  0.1 mg Oral BH-qamhs Donell Sievert E, PA-C   0.1 mg at 11/28/18 3435   Followed by  . [START ON 11/30/2018] cloNIDine (CATAPRES) tablet 0.1 mg  0.1 mg Oral QAC breakfast Donell Sievert E, PA-C      . dicyclomine (BENTYL) tablet 20 mg  20 mg Oral Q6H PRN Donell Sievert E, PA-C   20 mg at 11/28/18 0615  . famotidine (PEPCID) tablet 20 mg  20 mg Oral BID Malvin Johns, MD      . FLUoxetine (PROZAC) capsule 20 mg  20 mg Oral Daily Malvin Johns, MD   20 mg at 11/28/18 6861  . gabapentin (NEURONTIN) capsule 300 mg  300 mg Oral TID Malvin Johns, MD   300 mg at 11/28/18 0814  . glipiZIDE (GLUCOTROL) tablet 5 mg  5 mg Oral Q breakfast Laveda Abbe, NP   5 mg at  11/28/18 6837  . hydrOXYzine (ATARAX/VISTARIL) tablet 25 mg  25 mg Oral Q6H PRN Kerry Hough, PA-C   25 mg at 11/28/18 0222  . loperamide (IMODIUM) capsule 2-4 mg  2-4 mg Oral PRN Kerry Hough, PA-C      . losartan (COZAAR) tablet 50 mg  50 mg Oral Daily Money, Gerlene Burdock, FNP   50 mg at 11/28/18 2902  . magnesium hydroxide (MILK OF MAGNESIA) suspension 30 mL  30 mL Oral Daily PRN Laveda Abbe, NP   30 mL at 11/27/18 0823  . metFORMIN (GLUCOPHAGE) tablet 500 mg  500 mg Oral BID WC Laveda Abbe, NP   500 mg at 11/28/18 1115  . methocarbamol (ROBAXIN) tablet 750 mg  750 mg Oral TID Malvin Johns, MD   750 mg at 11/28/18 0814  . naproxen (NAPROSYN) tablet 500 mg  500 mg Oral BID PRN Kerry Hough, PA-C   500 mg at 11/26/18 0150  . nicotine (NICODERM CQ - dosed in mg/24 hours) patch 21 mg  21 mg Transdermal Daily Cobos, Rockey Situ, MD   21 mg at 11/28/18 0813  . ondansetron (ZOFRAN-ODT) disintegrating tablet 4 mg  4 mg Oral Q6H PRN Donell Sievert E, PA-C      . pantoprazole (PROTONIX) EC tablet 40 mg  40 mg Oral Daily Money, Gerlene Burdock, FNP   40 mg at 11/28/18 5208  . polyethylene glycol (MIRALAX / GLYCOLAX) packet 17 g  17 g Oral Daily PRN Money, Gerlene Burdock, FNP   17 g at 11/28/18 0932  . prazosin (MINIPRESS) capsule 4 mg  4 mg Oral QHS Malvin Johns, MD   4 mg at 11/27/18 2200  . propranolol (INDERAL) tablet 40 mg  40 mg Oral TID Malvin Johns, MD      . traZODone (DESYREL) tablet 100 mg  100 mg Oral QHS PRN Money, Gerlene Burdock, FNP   100 mg at 11/27/18 2201    Lab Results:  Results for orders placed or performed during the hospital encounter of 11/24/18 (from the past 48 hour(s))  Glucose, capillary     Status: Abnormal   Collection Time: 11/27/18  5:38 AM  Result Value Ref Range   Glucose-Capillary 225 (H) 70 - 99 mg/dL   Comment 1 Notify RN    Comment 2 Document in Chart   Glucose, capillary     Status: Abnormal   Collection Time: 11/28/18  6:12  AM  Result Value Ref Range    Glucose-Capillary 223 (H) 70 - 99 mg/dL   Comment 1 Notify RN    Comment 2 Document in Chart     Blood Alcohol level:  Lab Results  Component Value Date   ETH <10 11/23/2018    Metabolic Disorder Labs: Lab Results  Component Value Date   HGBA1C 10.6 (H) 07/10/2018   MPG 257.52 07/10/2018   No results found for: PROLACTIN Lab Results  Component Value Date   CHOL 183 07/10/2018   TRIG 139 07/10/2018   HDL 54 07/10/2018   CHOLHDL 3.4 07/10/2018   VLDL 28 07/10/2018   LDLCALC 101 (H) 07/10/2018    Physical Findings: AIMS: Facial and Oral Movements Muscles of Facial Expression: None, normal Lips and Perioral Area: None, normal Jaw: None, normal Tongue: None, normal,Extremity Movements Upper (arms, wrists, hands, fingers): None, normal Lower (legs, knees, ankles, toes): None, normal, Trunk Movements Neck, shoulders, hips: None, normal, Overall Severity Severity of abnormal movements (highest score from questions above): None, normal Incapacitation due to abnormal movements: None, normal Patient's awareness of abnormal movements (rate only patient's report): No Awareness, Dental Status Current problems with teeth and/or dentures?: No Does patient usually wear dentures?: No  CIWA:    COWS:  COWS Total Score: 2  Musculoskeletal: Strength & Muscle Tone: within normal limits Gait & Station: normal Patient leans: N/A  Psychiatric Specialty Exam: Physical Exam  ROS  Blood pressure (!) 138/104, pulse 88, temperature 97.6 F (36.4 C), temperature source Oral, resp. rate 16, height  (1.676 m), weight 71.2 kg, SpO2 99 %.Body mass index is 25.34 kg/m.  General Appearance: Casual  Eye Contact:  Good  Speech:  Clear and Coherent  Volume:  Increased  Mood:  Anxious and Dysphoric  Affect:  Congruent and Constricted  Thought Process:  Coherent  Orientation:  Full (Time, Place, and Person)  Thought Content:  Tangential  Suicidal Thoughts:  No  Homicidal Thoughts:  No   Memory:  Immediate;   Good  Judgement:  Good  Insight:  Good  Psychomotor Activity:  Normal  Concentration:  Concentration: Good  Recall:  Good  Fund of Knowledge:  Good  Language:  Good  Akathisia:  neg  Handed:  Right  AIMS (if indicated):     Assets:  Resilience Social Support  ADL's:  Intact  Cognition:  WNL  Sleep:  Number of Hours: 5.25     Treatment Plan Summary: Daily contact with patient to assess and evaluate symptoms and progress in treatment and Medication management continue current beta-blocker but escalate the dose continue prazosin add Pepcid to already extensive GI regimen continue cognitive therapy rehab therapy probable discharge tomorrow  Malvin Johns, MD 11/28/2018, 9:40 AM

## 2018-11-29 MED ORDER — PROPRANOLOL HCL 40 MG PO TABS: 40.0000 mg | ORAL_TABLET | Freq: Three times a day (TID) | ORAL | 2 refills | Status: DC

## 2018-11-29 MED ORDER — FLUOXETINE HCL 20 MG PO CAPS: 20.0000 mg | ORAL_CAPSULE | Freq: Every day | ORAL | 2 refills | Status: DC

## 2018-11-29 MED ORDER — DICYCLOMINE HCL 20 MG PO TABS: 20.0000 mg | ORAL_TABLET | Freq: Four times a day (QID) | ORAL | 1 refills | Status: DC | PRN

## 2018-11-29 MED ORDER — GABAPENTIN 300 MG PO CAPS: 300.0000 mg | ORAL_CAPSULE | Freq: Three times a day (TID) | ORAL | 2 refills | Status: DC

## 2018-11-29 MED ORDER — POLYETHYLENE GLYCOL 3350 17 G PO PACK
34.0000 g | PACK | Freq: Once | ORAL | Status: DC
  Filled 2018-11-29 (×2): qty 2

## 2018-11-29 MED ORDER — PRAZOSIN HCL 2 MG PO CAPS: 4.0000 mg | ORAL_CAPSULE | Freq: Every day | ORAL | 1 refills | Status: DC

## 2018-11-29 MED ORDER — HYDROXYZINE HCL 25 MG PO TABS: 25.0000 mg | ORAL_TABLET | Freq: Four times a day (QID) | ORAL | 0 refills | Status: DC | PRN

## 2018-11-29 MED ORDER — TRAZODONE HCL 100 MG PO TABS: 100.0000 mg | ORAL_TABLET | Freq: Every evening | ORAL | 0 refills | Status: DC | PRN

## 2018-11-29 MED ORDER — PANTOPRAZOLE SODIUM 40 MG PO TBEC: 40.0000 mg | DELAYED_RELEASE_TABLET | Freq: Two times a day (BID) | ORAL | 1 refills | Status: DC

## 2018-11-29 NOTE — Progress Notes (Signed)
Pt said his day was a 7. The one positive thing that happen they went to the gym to play a good game of basketball.

## 2018-11-29 NOTE — BHH Suicide Risk Assessment (Signed)
[facility] Discharge Suicide Risk Assessment   Principal Problem: MDD (major depressive disorder), recurrent severe, without psychosis (HCC) Discharge Diagnoses: Principal Problem:   MDD (major depressive disorder), recurrent severe, without psychosis (HCC) Active Problems:   Opioid use with opioid-induced mood disorder (HCC)   Constipation   Total Time spent with patient: 45 minutes  Musculoskeletal: Strength & Muscle Tone: within normal limits Gait & Station: normal Patient leans: N/A  Psychiatric Specialty Exam: ROS  Blood pressure (!) 144/90, pulse 80, temperature 98.3 F (36.8 C), temperature source Oral, resp. rate 16, height 5\' 6"  (1.676 m), weight 71.2 kg, SpO2 99 %.Body mass index is 25.34 kg/m.  General Appearance: Casual  Eye Contact::  Good  Speech:  Clear and Coherent409  Volume:  Normal  Mood:  Euthymic  Affect:  Full Range  Thought Process:  Goal Directed and Linear  Orientation:  Full (Time, Place, and Person)  Thought Content:  Logical and Tangential  Suicidal Thoughts:  No  Homicidal Thoughts:  No  Memory:  3/3  Judgement:  Intact  Insight:  good  Psychomotor Activity:  NA and Normal  Concentration:  Good  Recall:  Good  Fund of Knowledge:Good  Language: Good  Akathisia:  Negative  Handed:  Right  AIMS (if indicated):     Assets:  Resilience Social Support Talents/Skills  Sleep:  Number of Hours: 5  Cognition: WNL  ADL's:  Intact   Mental Status Per Nursing Assessment::   On Admission:  Suicidal ideation indicated by patient, Suicide plan, Self-harm thoughts  Demographic Factors:  Male, Living alone and Unemployed  Loss Factors: NA  Historical Factors: NA  Risk Reduction Factors:   Positive social support  Continued Clinical Symptoms:  Alcohol/Substance Abuse/Dependencies  Cognitive Features That Contribute To Risk:  None    Suicide Risk:  Minimal: No identifiable suicidal ideation.  Patients presenting with no risk factors but with  morbid ruminations; may be classified as minimal risk based on the severity of the depressive symptoms  Follow-up Information    [facility] Follow up on 12/01/2018.   Why:  Hospital follow up appointment is Thursday, 5/21 at 10:30a.  The appointment will be held over the phone and the provider will contact you.  Contact information: [address] [place] [address] [phone]        [facility].   Why:  Patient needs to complete paperwork in order to be transferred from the [facility] location for primary care and psychiatry. Office will mail the paperwork, please complete and mail back. Contact information: [address] [place], [place] [address] Phone: [phone] Fax: [phone]          Plan Of Care/Follow-up recommendations:  Activity:  full  Caylen Kuwahara, MD 11/29/2018, 9:39 AM

## 2018-11-29 NOTE — Progress Notes (Signed)
Pt received both written and verbal discharge instructions. Pt verbalized understanding of discharge instructions. Pt agreed to f/u appt and med regimen. Pt received SRA, AVS, transitional record and prescriptions. Pt gathered belongings from room and locker. Pt safely discharged to the lobby.

## 2018-11-29 NOTE — Discharge Summary (Signed)
Physician Discharge Summary Note  Patient:  Dave Moran is an 53 y.o., male MRN:  [id number] DOB:  1965-09-30 Patient phone:  [phone] (home)  Patient address:   [address] [place] [address],  Total Time spent with patient: 15 minutes  Date of Admission:  11/24/2018 Date of Discharge: 11/29/18  Reason for Admission:  Heroin dependence with suicidal ideation  Principal Problem: MDD (major depressive disorder), recurrent severe, without psychosis ([place]) Discharge Diagnoses: Principal Problem:   MDD (major depressive disorder), recurrent severe, without psychosis ([place]) Active Problems:   Opioid use with opioid-induced mood disorder ([place])   Constipation   Past Psychiatric History: History of heroin dependence. History of suicide attempt by overdose five years ago.  Past Medical History:  Past Medical History:  Diagnosis Date  . Chest pain 07/09/2018  . Diabetes mellitus type 2, noninsulin dependent ([place])   . Essential hypertension   . Hypertensive urgency   . Hypokalemia     Past Surgical History:  Procedure Laterality Date  . CHOLECYSTECTOMY     Family History: History reviewed. No pertinent family history. Family Psychiatric  History: Denies Social History:  Social History   Substance and Sexual Activity  Alcohol Use Yes     Social History   Substance and Sexual Activity  Drug Use Never    Social History   Socioeconomic History  . Marital status: Significant Other    Spouse name: Not on file  . Number of children: Not on file  . Years of education: Not on file  . Highest education level: Not on file  Occupational History  . Not on file  Social Needs  . Financial resource strain: Not on file  . Food insecurity:    Worry: Not on file    Inability: Not on file  . Transportation needs:    Medical: Not on file    Non-medical: Not on file  Tobacco Use  . Smoking status: Current Every Day Smoker    Types: Cigarettes  . Smokeless tobacco: Never  Used  Substance and Sexual Activity  . Alcohol use: Yes  . Drug use: Never  . Sexual activity: Not on file  Lifestyle  . Physical activity:    Days per week: Not on file    Minutes per session: Not on file  . Stress: Not on file  Relationships  . Social connections:    Talks on phone: Not on file    Gets together: Not on file    Attends religious service: Not on file    Active member of club or organization: Not on file    Attends meetings of clubs or organizations: Not on file    Relationship status: Not on file  Other Topics Concern  . Not on file  Social History Narrative  . Not on file    Hospital Course:  From admission assessment: Patient had called the [facility] helpline.  They sent police to his residence to bring him to get help.  He arrived at [facility] unaccompanied. Patient says he has SI with a plan to overdose on heroin.  He attempted the same about five years ago.  He reports deepening depression and an increase in anxiety. Patient denies any HI or A/V hallucinations. Patient has been clean for 5 years.  About 4 months ago he relapsed on heroin.  He has been snorting about a gram per day for the last few months.  Last use was on Monday (05/11).  He is now complaining of stomach  cramps, nausea, vomiting, diarrhea, sweats.  He denies hx of seizures.  Patient denies regular use of ETOH or other drugs. Patient also says he is supposed to be on Metformin and a BP med but has been on either for 4 months. Patient lives with his girlfriend/fiance.  He says that if it had not been for her he would have gone ahead and overdosed on heroin.  He says that if he leaves he will try to kill himself. Patient is tearful at times.  He is restless.  Patient wants to get help.  He said that he was at a [place] facility in [place] about 5 years ago.  He has no current outpatient care.  From admission H&P: Mr. Hinton is 53 [facility] of [facility] he is been dependent on heroin he  had relapse after 5 years of sobriety he clearly needs detox measures.  He also has history of PTSD. Drug screen positive for opiates- Patient states that he relapsed 4 months ago and he began having suicidal thoughts told his fiance, who also agreed that he get help.  He actually overdosed while intoxicated about 6 years ago and survived that of course but he states he does not have a specific plan now.  PTSD symptoms include worsening nightmares and anxiety. States he is not currently on an antidepressant. Denies current auditory visual loose Nations reports cramping his blood pressure is up, he reports that he can contract for safety here understands what that means.  Mr. Demeo was admitted for heroin dependence with suicidal ideation. He was started on clonidine COWS protocol for withdrawal. He was started on Prozac, Minipress, propanolol, PRN Vistaril and PRN trazodone. Bentyl and Protonix were started for abdominal cramping. Losartan was restarted for HTN. Glipizide and metformin were restarted for diabetes. He participated in group therapy on the unit. He responded well to treatment with no adverse effects reported. He remained on the [facility] unit for 5 days. He stabilized with medication and therapy. He was discharged on the medications listed below. He has shown improvement with improved mood, affect, sleep, appetite, and interaction. He denies any SI/HI/AVH and contracts for safety. He agrees to follow up at [facility] (see below). He is provided with prescriptions for medications upon discharge. His fiance is picking him up for discharge home.  Physical Findings: AIMS: Facial and Oral Movements Muscles of Facial Expression: None, normal Lips and Perioral Area: None, normal Jaw: None, normal Tongue: None, normal,Extremity Movements Upper (arms, wrists, hands, fingers): None, normal Lower (legs, knees, ankles, toes): None, normal, Trunk Movements Neck, shoulders, hips: None,  normal, Overall Severity Severity of abnormal movements (highest score from questions above): None, normal Incapacitation due to abnormal movements: None, normal Patient's awareness of abnormal movements (rate only patient's report): No Awareness, Dental Status Current problems with teeth and/or dentures?: No Does patient usually wear dentures?: No  CIWA:    COWS:  COWS Total Score: 2  Musculoskeletal: Strength & Muscle Tone: within normal limits Gait & Station: normal Patient leans: N/A  Psychiatric Specialty Exam: Physical Exam  Nursing note and vitals reviewed. Constitutional: He is oriented to person, place, and time. He appears well-developed and well-nourished.  Cardiovascular: Normal rate.  Respiratory: Effort normal.  Neurological: He is alert and oriented to person, place, and time.    Review of Systems  Constitutional: Negative.   Psychiatric/Behavioral: Positive for substance abuse (heroin). Negative for depression, hallucinations and suicidal ideas. The patient is not  nervous/anxious and does not have insomnia.     Blood pressure (!) 144/90, pulse 80, temperature 98.3 F (36.8 C), temperature source Oral, resp. rate 16, height _0  (1.676 m), weight 71.2 kg, SpO2 99 %.Body mass index is 25.34 kg/m.  See MD's discharge SRA     Have you used any form of tobacco in the last 30 days? (Cigarettes, Smokeless Tobacco, Cigars, and/or Pipes): Yes  Has this patient used any form of tobacco in the last 30 days? (Cigarettes, Smokeless Tobacco, Cigars, and/or Pipes)  Yes, A prescription for an FDA-approved tobacco cessation medication was offered at discharge and the patient refused  Blood Alcohol level:  Lab Results  Component Value Date   ETH <10 96/78/9381    Metabolic Disorder Labs:  Lab Results  Component Value Date   HGBA1C 10.6 (H) 07/10/2018   MPG 257.52 07/10/2018   No results found for: PROLACTIN Lab Results  Component Value Date   CHOL 183 07/10/2018    TRIG 139 07/10/2018   HDL 54 07/10/2018   CHOLHDL 3.4 07/10/2018   VLDL 28 07/10/2018   LDLCALC 101 (H) 07/10/2018    See Psychiatric Specialty Exam and Suicide Risk Assessment completed by Attending Physician prior to discharge.  Discharge destination:  Home  Is patient on multiple antipsychotic therapies at discharge:  No   Has Patient had three or more failed trials of antipsychotic monotherapy by history:  No  Recommended Plan for Multiple Antipsychotic Therapies: NA   Allergies as of 11/29/2018   No Known Allergies     Medication List    TAKE these medications     Indication  atorvastatin 40 MG tablet Commonly known as:  LIPITOR Take 1 tablet (40 mg total) by mouth daily at 6 PM.  Indication:  Inherited Homozygous Hypercholesterolemia   blood glucose meter kit and supplies Kit Dispense glucometer based on patient and insurance preference. Use up to four times daily as directed. (FOR ICD-9 250.00, 250.01).  Indication:  21-Hydroxylase Deficiency   dicyclomine 20 MG tablet Commonly known as:  BENTYL Take 1 tablet (20 mg total) by mouth every 6 (six) hours as needed for spasms (abdominal cramping).  Indication:  Irritable Bowel Syndrome   FLUoxetine 20 MG capsule Commonly known as:  PROZAC Take 1 capsule (20 mg total) by mouth daily. Start taking on:  Nov 30, 2018  Indication:  Depression   gabapentin 300 MG capsule Commonly known as:  NEURONTIN Take 1 capsule (300 mg total) by mouth 3 (three) times daily.  Indication:  Abuse or Misuse of Alcohol   glipiZIDE 5 MG tablet Commonly known as:  GLUCOTROL Take 1 tablet (5 mg total) by mouth 2 (two) times daily before a meal. What changed:  Another medication with the same name was removed. Continue taking this medication, and follow the directions you see here.  Indication:  Type 2 Diabetes   hydrOXYzine 25 MG tablet Commonly known as:  ATARAX/VISTARIL Take 1 tablet (25 mg total) by mouth every 6 (six) hours as  needed for anxiety.  Indication:  Feeling Anxious   losartan 50 MG tablet Commonly known as:  COZAAR Take 1 tablet (50 mg total) by mouth daily.  Indication:  High Blood Pressure Disorder   metFORMIN 500 MG tablet Commonly known as:  GLUCOPHAGE Take 1 tablet (500 mg total) by mouth 2 (two) times daily with a meal.  Indication:  Type 2 Diabetes   pantoprazole 40 MG tablet Commonly known as:  PROTONIX Take 1 tablet (  40 mg total) by mouth 2 (two) times daily.  Indication:  Gastroesophageal Reflux Disease   polyethylene glycol 17 g packet Commonly known as:  MIRALAX / GLYCOLAX Take 17 g by mouth 2 (two) times daily.  Indication:  Constipation   prazosin 2 MG capsule Commonly known as:  MINIPRESS Take 2 capsules (4 mg total) by mouth at bedtime.  Indication:  Frightening Dreams   propranolol 40 MG tablet Commonly known as:  INDERAL Take 1 tablet (40 mg total) by mouth 3 (three) times daily.  Indication:  Fine to Coarse Slow Tremor affecting Head, Hands & Voice, High Blood Pressure Disorder   traZODone 100 MG tablet Commonly known as:  DESYREL Take 1 tablet (100 mg total) by mouth at bedtime as needed for sleep.  Indication:  Trouble Sleeping      Follow-up Information    [facility] Follow up on 12/01/2018.   Why:  Hospital follow up appointment is Thursday, 5/21 at 10:30a.  The appointment will be held over the phone and the provider will contact you.  Contact information: [place] [address] [phone]        [facility] Follow up.   Why:  Patient needs to complete paperwork in order to be transferred from the [facility] location for primary care and psychiatry. Office will mail the paperwork, please complete and mail back. Contact information: [address] [place], [place] [address] Phone: [phone] Fax: [phone]          Follow-up recommendations: Follow up with primary care provider for chronic constipation.  Activity as tolerated. Diet as recommended by primary care physician. Keep all scheduled follow-up appointments as recommended.   Comments:   Patient is instructed to take all prescribed medications as recommended. Report any side effects or adverse reactions to your outpatient psychiatrist. Patient is instructed to abstain from alcohol and illegal drugs while on prescription medications. In the event of worsening symptoms, patient is instructed to call the crisis hotline, 911, or go to the nearest emergency department for evaluation and treatment.  Signed: Connye Burkitt, NP 11/29/2018, 10:04 AM

## 2018-11-29 NOTE — Progress Notes (Signed)
DAR NOTE: Pt present with bright affect and calm mood in the unit. Pt has been observed in the milieu interacting well with peers. Pt complained of stomach cramp, stating that he has not been moving his bowels regularly. Complained of not being able to fall a sleep, took all his meds as scheduled.  Pt's safety ensured with 15 minute and environmental checks. Pt currently denies SI/HI and A/V hallucinations. Pt verbally agrees to seek staff if SI/HI or A/VH occurs and to consult with staff before acting on these thoughts. Will continue POC.

## 2018-11-29 NOTE — Progress Notes (Signed)
  [facility] Adult Case Management Discharge Plan :  Will you be returning to the same living situation after discharge:  Yes,  with fiance At discharge, do you have transportation home?: Yes,  fiance Do you have the ability to pay for your medications: No.; going to [facility]  Release of information consent forms completed and in the chart;  Patient's signature needed at discharge.  Patient to Follow up at: Follow-up Information    [facility] Follow up on 12/01/2018.   Why:  Hospital follow up appointment is Thursday, 5/21 at 10:30a.  The appointment will be held over the phone and the provider will contact you.  Contact information: [address] [place] [address] [phone]        [facility].   Why:  pt would like to be transferred from [facility] to [place] if possible (for psych and pcp) Contact information: [address] [place], [place] [address] Phone: [phone] Fax: [phone]          Next level of care provider has access to [facility] Link:no  Safety Planning and Suicide Prevention discussed: Yes,  with fiance  Have you used any form of tobacco in the last 30 days? (Cigarettes, Smokeless Tobacco, Cigars, and/or Pipes): Yes  Has patient been referred to the Quitline?: Yes, faxed on 11/29/2018  Patient has been referred for addiction treatment: Yes  Delphia Grates, LCSW 11/29/2018, 9:08 AM

## 2019-02-05 ENCOUNTER — Emergency Department (HOSPITAL_COMMUNITY)
Admission: EM | Admit: 2019-02-05 | Discharge: 2019-02-05 | Disposition: A | Discharge status: Home / Self Care | Payer: No Typology Code available for payment source | Attending: Emergency Medicine | Admitting: Emergency Medicine

## 2019-02-05 ENCOUNTER — Encounter (HOSPITAL_COMMUNITY): Payer: Self-pay

## 2019-02-05 ENCOUNTER — Other Ambulatory Visit: Payer: Self-pay

## 2019-02-05 DIAGNOSIS — Z79899 Other long term (current) drug therapy: Secondary | ICD-10-CM | POA: Insufficient documentation

## 2019-02-05 DIAGNOSIS — K5900 Constipation, unspecified: Secondary | ICD-10-CM | POA: Insufficient documentation

## 2019-02-05 DIAGNOSIS — Z7984 Long term (current) use of oral hypoglycemic drugs: Secondary | ICD-10-CM | POA: Diagnosis not present

## 2019-02-05 DIAGNOSIS — E119 Type 2 diabetes mellitus without complications: Secondary | ICD-10-CM | POA: Insufficient documentation

## 2019-02-05 DIAGNOSIS — R109 Unspecified abdominal pain: Secondary | ICD-10-CM | POA: Insufficient documentation

## 2019-02-05 DIAGNOSIS — K6289 Other specified diseases of anus and rectum: Secondary | ICD-10-CM | POA: Diagnosis not present

## 2019-02-05 DIAGNOSIS — F17210 Nicotine dependence, cigarettes, uncomplicated: Secondary | ICD-10-CM | POA: Insufficient documentation

## 2019-02-05 DIAGNOSIS — I10 Essential (primary) hypertension: Secondary | ICD-10-CM | POA: Insufficient documentation

## 2019-02-05 DIAGNOSIS — K5641 Fecal impaction: Secondary | ICD-10-CM

## 2019-02-05 MED ORDER — LIDOCAINE HCL URETHRAL/MUCOSAL 2 % EX GEL
1.0000 "application " | Freq: Once | CUTANEOUS | Status: AC
  Administered 2019-02-05: 1
  Filled 2019-02-05: qty 5

## 2019-02-05 MED ORDER — POLYETHYLENE GLYCOL 3350 17 G PO PACK: 17.0000 g | PACK | Freq: Two times a day (BID) | ORAL | 0 refills | Status: DC

## 2019-02-05 MED ORDER — SORBITOL 70 % SOLN
960.0000 mL | TOPICAL_OIL | Freq: Once | ORAL | Status: AC
  Administered 2019-02-05: 960 mL via RECTAL
  Filled 2019-02-05: qty 473

## 2019-02-05 NOTE — ED Notes (Signed)
ED Provider at bedside. 

## 2019-02-05 NOTE — ED Triage Notes (Signed)
Patient c/o no BM x 7 days. Patient states he has been using stool softeners, laxative, and suppositories with no relief.

## 2019-02-05 NOTE — ED Provider Notes (Signed)
[facility] DEPT Provider Note   CSN: [id number] Arrival date & time: 02/05/19  1316    History   Chief Complaint Chief Complaint  Patient presents with  . Constipation  . Rectal Pain  . Abdominal Pain    HPI Dave Moran is a 53 y.o. male.     Pt presents to the ED today with rectal pain and constipation.  The pt has a hx of constipation and ran out of his miralax about 2 weeks ago.  He has not had a bm in 1 week.  He feels like he has to go, but nothing will come out.  He has intense pain in his rectum.  No f/c.     Past Medical History:  Diagnosis Date  . Chest pain 07/09/2018  . Diabetes mellitus type 2, noninsulin dependent ([place])   . Essential hypertension   . Hypertensive urgency   . Hypokalemia     Patient Active Problem List   Diagnosis Date Noted  . Constipation 11/27/2018  . PTSD (post-traumatic stress disorder) 11/24/2018  . Opioid use with opioid-induced mood disorder ([place]) 11/24/2018  . MDD (major depressive disorder), recurrent severe, without psychosis ([place]) 11/24/2018  . Hypertensive urgency   . Chest pain 07/09/2018  . Diabetes mellitus type 2, noninsulin dependent ([place])   . Hypokalemia   . Essential hypertension     Past Surgical History:  Procedure Laterality Date  . CHOLECYSTECTOMY          Home Medications    Prior to Admission medications   Medication Sig Start Date End Date Taking? Authorizing Provider  acetaminophen (TYLENOL) 325 MG tablet Take 650 mg by mouth every 6 (six) hours as needed for moderate pain.   Yes [provider]  atorvastatin (LIPITOR) 40 MG tablet Take 1 tablet (40 mg total) by mouth daily at 6 PM. 07/11/18  Yes Samuella Cota, MD  blood glucose meter kit and supplies KIT Dispense glucometer based on patient and insurance preference. Use up to four times daily as directed. (FOR ICD-9 250.00, 250.01). 07/10/18  Yes Samuella Cota, MD  dicyclomine (BENTYL) 20 MG  tablet Take 1 tablet (20 mg total) by mouth every 6 (six) hours as needed for spasms (abdominal cramping). 11/29/18  Yes Johnn Hai, MD  FLUoxetine (PROZAC) 20 MG capsule Take 1 capsule (20 mg total) by mouth daily. 11/30/18  Yes Johnn Hai, MD  gabapentin (NEURONTIN) 300 MG capsule Take 1 capsule (300 mg total) by mouth 3 (three) times daily. 11/29/18  Yes Johnn Hai, MD  glipiZIDE (GLUCOTROL) 5 MG tablet Take 1 tablet (5 mg total) by mouth 2 (two) times daily before a meal. 07/10/18 02/05/19 Yes Samuella Cota, MD  hydrOXYzine (ATARAX/VISTARIL) 25 MG tablet Take 1 tablet (25 mg total) by mouth every 6 (six) hours as needed for anxiety. 11/29/18  Yes Connye Burkitt, NP  losartan (COZAAR) 50 MG tablet Take 1 tablet (50 mg total) by mouth daily. 07/11/18  Yes Samuella Cota, MD  metFORMIN (GLUCOPHAGE) 500 MG tablet Take 1 tablet (500 mg total) by mouth 2 (two) times daily with a meal. 07/10/18  Yes Samuella Cota, MD  pantoprazole (PROTONIX) 40 MG tablet Take 1 tablet (40 mg total) by mouth 2 (two) times daily. 11/29/18  Yes Johnn Hai, MD  prazosin (MINIPRESS) 2 MG capsule Take 2 capsules (4 mg total) by mouth at bedtime. 11/29/18  Yes Johnn Hai, MD  propranolol (INDERAL) 40 MG tablet Take 1 tablet (  40 mg total) by mouth 3 (three) times daily. 11/29/18  Yes Johnn Hai, MD  traZODone (DESYREL) 100 MG tablet Take 1 tablet (100 mg total) by mouth at bedtime as needed for sleep. 11/29/18  Yes Connye Burkitt, NP  polyethylene glycol (MIRALAX / GLYCOLAX) 17 g packet Take 17 g by mouth 2 (two) times daily. 02/05/19   Isla Pence, MD    Family History History reviewed. No pertinent family history.  Social History Social History   Tobacco Use  . Smoking status: Current Every Day Smoker    Packs/day: 0.15    Types: Cigarettes  . Smokeless tobacco: Never Used  Substance Use Topics  . Alcohol use: Not Currently  . Drug use: Not Currently    Comment: opiates, but is taking Methadone  at treatment Center     Allergies   Patient has no known allergies.   Review of Systems Review of Systems  Gastrointestinal: Positive for constipation.  All other systems reviewed and are negative.    Physical Exam Updated Vital Signs BP (!) 177/103 (BP Location: Left Arm)   Pulse 100   Temp 98.2 F (36.8 C) (Oral)   Resp 18   Ht '5\' 6"'  (1.676 m)   Wt 70.8 kg   SpO2 99%   BMI 25.18 kg/m   Physical Exam Vitals signs and nursing note reviewed. Exam conducted with a chaperone present.  Constitutional:      Appearance: He is well-developed.  HENT:     Head: Normocephalic and atraumatic.     Mouth/Throat:     Mouth: Mucous membranes are moist.  Eyes:     Extraocular Movements: Extraocular movements intact.     Pupils: Pupils are equal, round, and reactive to light.  Cardiovascular:     Rate and Rhythm: Normal rate and regular rhythm.  Pulmonary:     Effort: Pulmonary effort is normal.     Breath sounds: Normal breath sounds.  Abdominal:     General: Abdomen is flat. Bowel sounds are decreased.     Palpations: Abdomen is soft.  Genitourinary:    Comments: Impacted stool Skin:    General: Skin is warm.     Capillary Refill: Capillary refill takes less than 2 seconds.  Neurological:     General: No focal deficit present.     Mental Status: He is alert and oriented to person, place, and time.  Psychiatric:        Mood and Affect: Mood normal.        Behavior: Behavior normal.      ED Treatments / Results  Labs (all labs ordered are listed, but only abnormal results are displayed) Labs Reviewed - No data to display  EKG None  Radiology No results found.  Procedures Fecal disimpaction  Date/Time: 02/05/2019 2:40 PM Performed by: Isla Pence, MD Authorized by: Isla Pence, MD  Consent: Verbal consent obtained. Consent given by: patient Patient understanding: patient states understanding of the procedure being performed Patient consent: the  patient's understanding of the procedure matches consent given Patient identity confirmed: verbally with patient Time out: Immediately prior to procedure a "time out" was called to verify the correct patient, procedure, equipment, support staff and site/side marked as required. Local anesthesia used: yes  Anesthesia: Local anesthesia used: yes Local Anesthetic: topical anesthetic Anesthetic total: 3 mL  Sedation: Patient sedated: no  Patient tolerance: patient tolerated the procedure well with no immediate complications    (including critical care time)  Medications Ordered in ED  Medications  lidocaine (XYLOCAINE) 2 % jelly 1 application (1 application Other Given by Other 02/05/19 1422)  sorbitol, milk of mag, mineral oil, glycerin (SMOG) enema (960 mLs Rectal Given 02/05/19 1437)     Initial Impression / Assessment and Plan / ED Course  I have reviewed the triage vital signs and the nursing notes.  Pertinent labs & imaging results that were available during my care of the patient were reviewed by me and considered in my medical decision making (see chart for details).     Pt had a large bowel movement after disimpaction and enema.  He is feeling better.   The pt is encouraged to avoid opiates, eat increased fiber.  Return if worse.  Final Clinical Impressions(s) / ED Diagnoses   Final diagnoses:  Constipation, unspecified constipation type  Fecal impaction [facility])    ED Discharge Orders         Ordered    polyethylene glycol (MIRALAX / GLYCOLAX) 17 g packet  2 times daily     02/05/19 1544           Isla Pence, MD 02/05/19 1546

## 2021-03-20 IMAGING — CT CT ABDOMEN AND PELVIS WITH CONTRAST
2 of 5 series · 15 of 46 positions shown, 17 images · IV contrast (omnipaque)
Comparison: CT 07/23/2018

CLINICAL DATA: Right lower quadrant pain. Nausea and vomiting.

EXAM:
CT ABDOMEN AND PELVIS WITH CONTRAST
TECHNIQUE: Multidetector CT imaging of the abdomen and pelvis was performed
using the standard protocol following bolus administration of
intravenous contrast.
CONTRAST:  100mL OMNIPAQUE IOHEXOL 300 MG/ML  SOLN

[Series 2: axial st · axial · 0.72mm/px · z∈[-552,-142]mm · 12 of 94 slices shown, 14 images]
[im 6/94  soft-tissue]
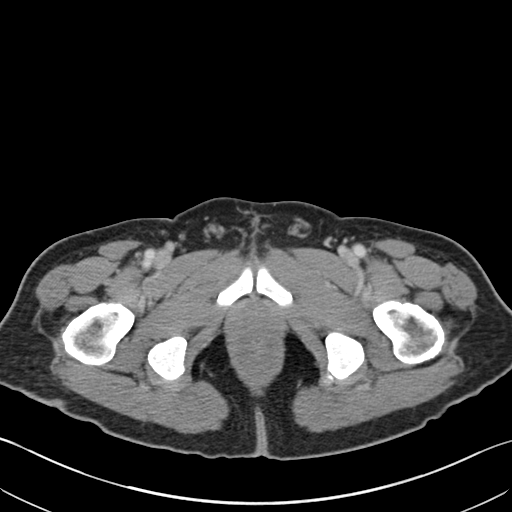
[im 6/94  bone]
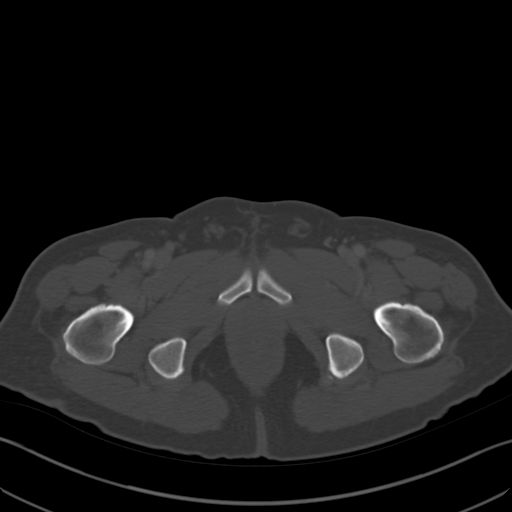
[im 17/94  soft-tissue]
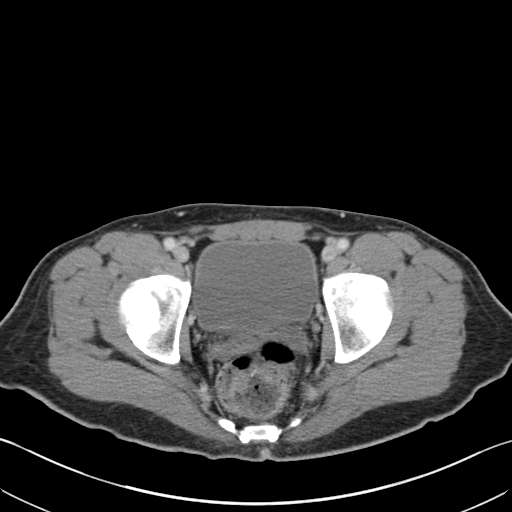
[im 22/94  soft-tissue]
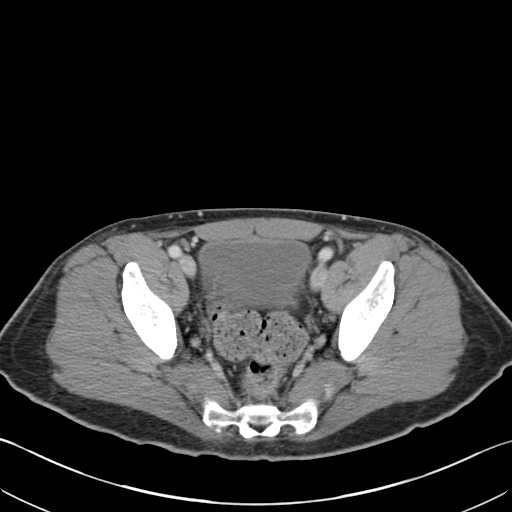
[im 28/94  soft-tissue]
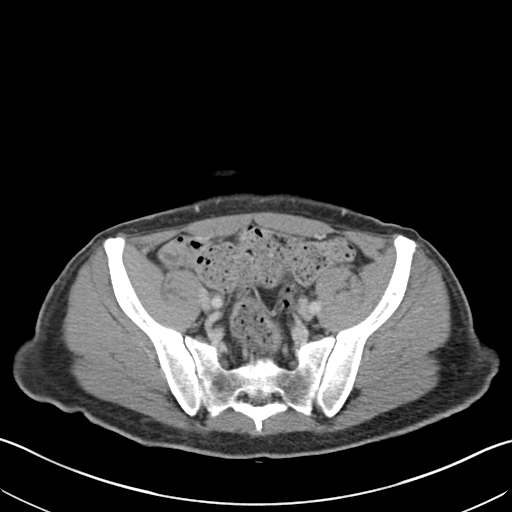
[im 39/94  soft-tissue]
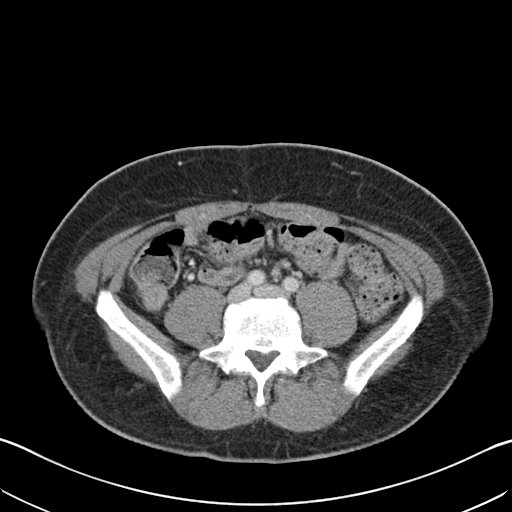
[im 44/94  soft-tissue]
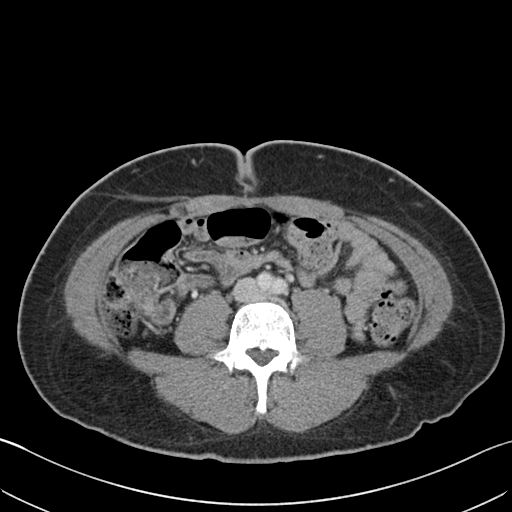
[im 50/94  soft-tissue]
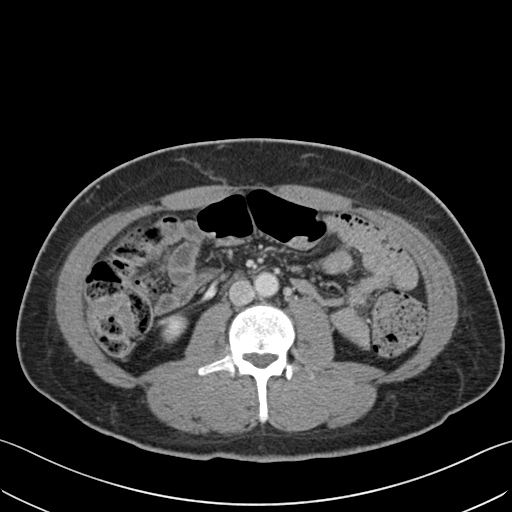
[im 61/94  soft-tissue]
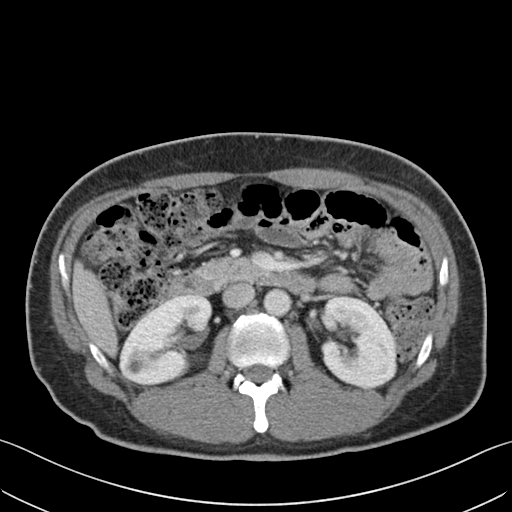
[im 66/94  soft-tissue]
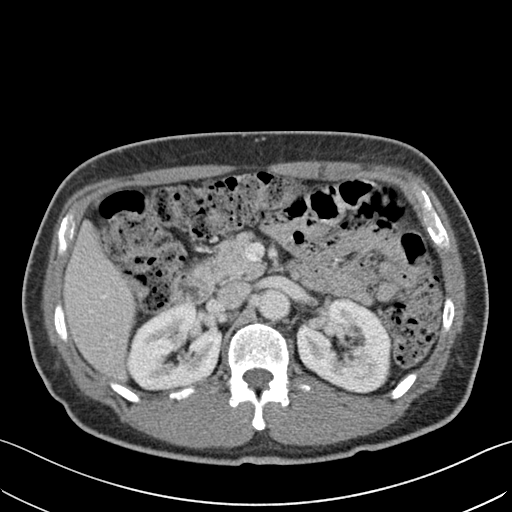
[im 66/94  bone]
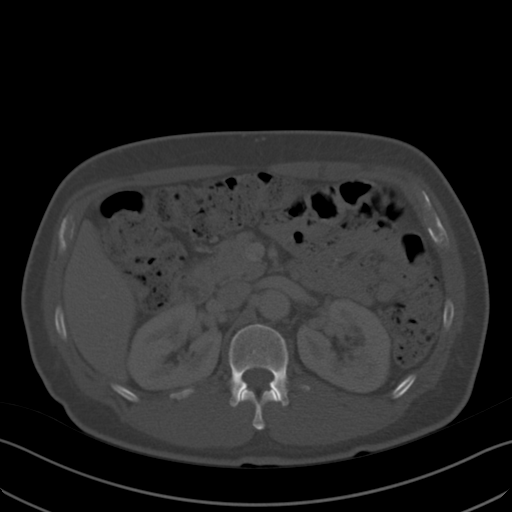
[im 72/94  soft-tissue]
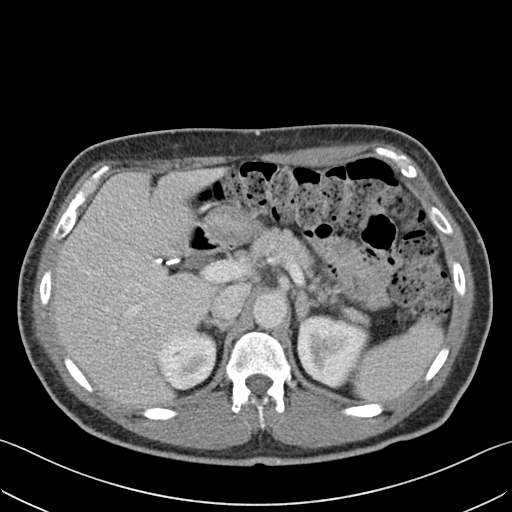
[im 83/94  soft-tissue]
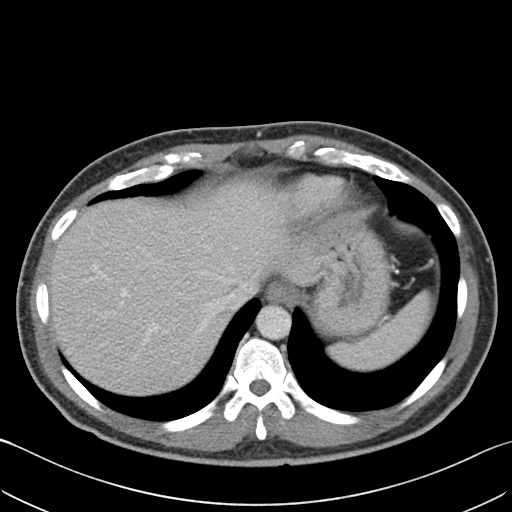
[im 88/94  soft-tissue]
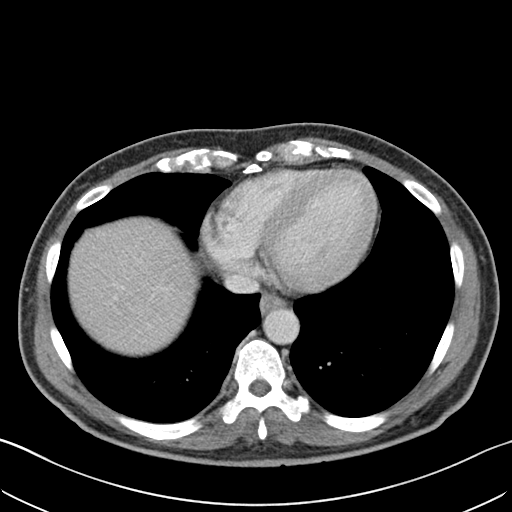

[Series 5: coronal st · coronal · 0.66mm/px · 3 of 119 slices shown]
[im 40/119  soft-tissue]
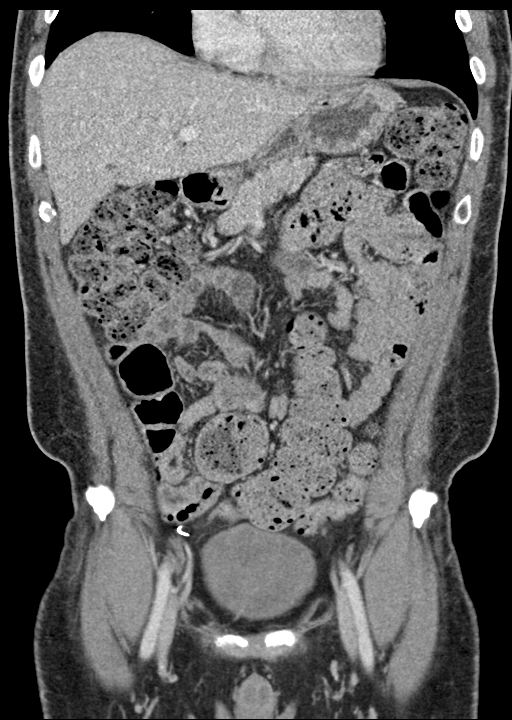
[im 53/119  soft-tissue]
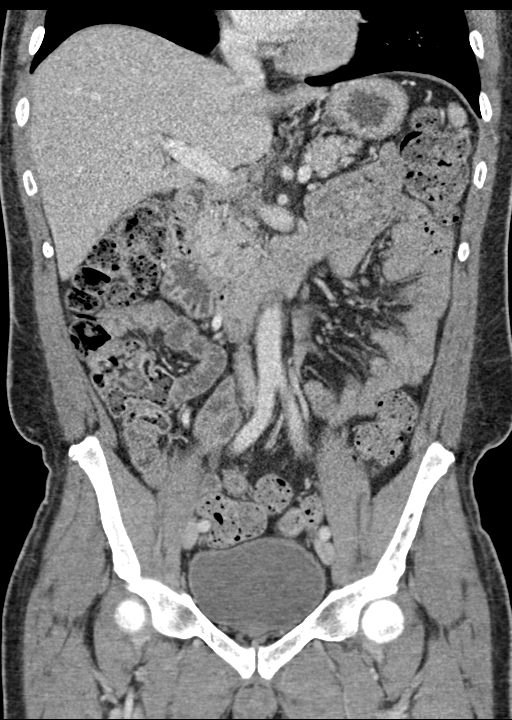
[im 66/119  soft-tissue]
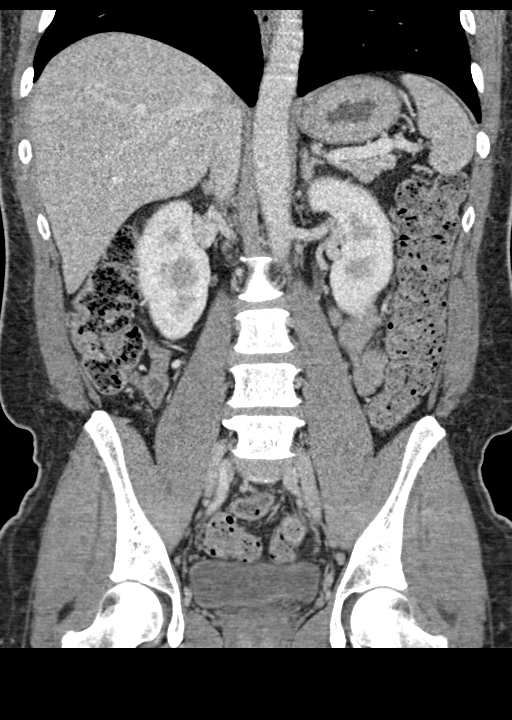

[15 of 46 positions shown; findings below may reference images not displayed]

FINDINGS: Lower chest: Lung bases are clear.

Hepatobiliary: No focal liver abnormality is seen. Status post
cholecystectomy. No biliary dilatation.

Pancreas: No ductal dilatation or inflammation.

Spleen: Normal in size without focal abnormality.

Adrenals/Urinary Tract: Left adrenal fullness without dominant
nodule. Normal right adrenal gland. No hydronephrosis or perinephric
edema. Homogeneous renal enhancement with symmetric excretion on
delayed phase imaging. Unchanged cyst in the right kidney. Urinary
bladder is physiologically distended without wall thickening.

Stomach/Bowel: Large colonic stool burden without colonic wall
thickening or inflammation. No small bowel dilatation, inflammatory
change or abnormal distention. Stomach physiologically distended.
Appendix not definitively visualized, no evidence of appendicitis or
inflammatory change in the right lower quadrant. Diminutive appendix
tentatively but not definitively seen. Regardless, no evidence of
appendicitis. No terminal ileal inflammation.

Vascular/Lymphatic: Caliber abdominal aorta. No acute vascular
finding. Portal vein and mesenteric vessels are patent. No
adenopathy.

Reproductive: Prostate is unremarkable.

Other: Tiny fat containing umbilical hernia. Minimal fat in both
inguinal canals. No ascites or free air. No intra-abdominal abscess.

Musculoskeletal: There are no acute or suspicious osseous
abnormalities. Prominent Schmorl's node inferior endplate of L2 is
unchanged.
IMPRESSION: 1. No acute abnormality in the abdomen/pelvis. Appendix not well
visualized but no evidence of appendicitis.
2. Large colonic stool burden suggesting constipation.

## 2024-03-17 ENCOUNTER — Encounter (HOSPITAL_COMMUNITY): Payer: Self-pay | Admitting: *Deleted

## 2024-03-17 ENCOUNTER — Emergency Department (HOSPITAL_COMMUNITY)

## 2024-03-17 ENCOUNTER — Other Ambulatory Visit: Payer: Self-pay

## 2024-03-17 ENCOUNTER — Observation Stay (HOSPITAL_COMMUNITY)
Admission: EM | Admit: 2024-03-17 | Discharge: 2024-03-19 | Disposition: A | Discharge status: Home / Self Care | Attending: Family Medicine | Admitting: Family Medicine

## 2024-03-17 DIAGNOSIS — Z7982 Long term (current) use of aspirin: Secondary | ICD-10-CM | POA: Insufficient documentation

## 2024-03-17 DIAGNOSIS — K760 Fatty (change of) liver, not elsewhere classified: Secondary | ICD-10-CM | POA: Insufficient documentation

## 2024-03-17 DIAGNOSIS — I160 Hypertensive urgency: Secondary | ICD-10-CM | POA: Diagnosis not present

## 2024-03-17 DIAGNOSIS — F32A Depression, unspecified: Secondary | ICD-10-CM | POA: Diagnosis not present

## 2024-03-17 DIAGNOSIS — R809 Proteinuria, unspecified: Secondary | ICD-10-CM | POA: Diagnosis not present

## 2024-03-17 DIAGNOSIS — E871 Hypo-osmolality and hyponatremia: Secondary | ICD-10-CM

## 2024-03-17 DIAGNOSIS — E279 Disorder of adrenal gland, unspecified: Secondary | ICD-10-CM | POA: Insufficient documentation

## 2024-03-17 DIAGNOSIS — R079 Chest pain, unspecified: Principal | ICD-10-CM | POA: Diagnosis present

## 2024-03-17 DIAGNOSIS — E119 Type 2 diabetes mellitus without complications: Secondary | ICD-10-CM | POA: Insufficient documentation

## 2024-03-17 DIAGNOSIS — R519 Headache, unspecified: Secondary | ICD-10-CM | POA: Diagnosis not present

## 2024-03-17 DIAGNOSIS — I10 Essential (primary) hypertension: Secondary | ICD-10-CM

## 2024-03-17 DIAGNOSIS — F419 Anxiety disorder, unspecified: Secondary | ICD-10-CM | POA: Insufficient documentation

## 2024-03-17 DIAGNOSIS — Z23 Encounter for immunization: Secondary | ICD-10-CM | POA: Insufficient documentation

## 2024-03-17 DIAGNOSIS — N400 Enlarged prostate without lower urinary tract symptoms: Secondary | ICD-10-CM | POA: Diagnosis not present

## 2024-03-17 DIAGNOSIS — R109 Unspecified abdominal pain: Secondary | ICD-10-CM | POA: Diagnosis not present

## 2024-03-17 HISTORY — DX: Hyperlipidemia, unspecified: E78.5

## 2024-03-17 HISTORY — DX: Calculus of kidney: N20.0

## 2024-03-17 LAB — COMPREHENSIVE METABOLIC PANEL WITH GFR
ALT: 35 U/L (ref 0–44)
AST: 25 U/L (ref 15–41)
Albumin: 4 g/dL (ref 3.5–5.0)
Alkaline Phosphatase: 77 U/L (ref 38–126)
Anion gap: 12 (ref 5–15)
BUN: 12 mg/dL (ref 6–20)
CO2: 24 mmol/L (ref 22–32)
Calcium: 9.3 mg/dL (ref 8.9–10.3)
Chloride: 97 mmol/L — ABNORMAL LOW (ref 98–111)
Creatinine, Ser: 0.97 mg/dL (ref 0.61–1.24)
GFR, Estimated: >60 mL/min (ref >60)
Glucose, Bld: 236 mg/dL — ABNORMAL HIGH (ref 70–99)
Potassium: 4.4 mmol/L (ref 3.5–5.1)
Sodium: 133 mmol/L — ABNORMAL LOW (ref 135–145)
Total Bilirubin: 0.6 mg/dL (ref 0.0–1.2)
Total Protein: 7.3 g/dL (ref 6.5–8.1)

## 2024-03-17 LAB — URINALYSIS, ROUTINE W REFLEX MICROSCOPIC
Bacteria, UA: NONE SEEN
Bilirubin Urine: NEGATIVE
Glucose, UA: >=500 mg/dL — AB
Ketones, ur: 20 mg/dL — AB
Leukocytes,Ua: NEGATIVE
Nitrite: NEGATIVE
Protein, ur: 100 mg/dL — AB
Specific Gravity, Urine: 1.024 (ref 1.005–1.030)
pH: 5 (ref 5.0–8.0)

## 2024-03-17 LAB — CBC
HCT: 49.7 % (ref 39.0–52.0)
Hemoglobin: 17 g/dL (ref 13.0–17.0)
MCH: 30.4 pg (ref 26.0–34.0)
MCHC: 34.2 g/dL (ref 30.0–36.0)
MCV: 88.9 fL (ref 80.0–100.0)
Platelets: 309 K/uL (ref 150–400)
RBC: 5.59 MIL/uL (ref 4.22–5.81)
RDW: 12.2 % (ref 11.5–15.5)
WBC: 7.2 K/uL (ref 4.0–10.5)
nRBC: 0 % (ref 0.0–0.2)

## 2024-03-17 LAB — I-STAT CHEM 8, ED
BUN: 13 mg/dL (ref 6–20)
Calcium, Ion: 1.16 mmol/L (ref 1.15–1.40)
Chloride: 99 mmol/L (ref 98–111)
Creatinine, Ser: 1 mg/dL (ref 0.61–1.24)
Glucose, Bld: 232 mg/dL — ABNORMAL HIGH (ref 70–99)
HCT: 52 % (ref 39.0–52.0)
Hemoglobin: 17.7 g/dL — ABNORMAL HIGH (ref 13.0–17.0)
Potassium: 4.5 mmol/L (ref 3.5–5.1)
Sodium: 134 mmol/L — ABNORMAL LOW (ref 135–145)
TCO2: 23 mmol/L (ref 22–32)

## 2024-03-17 LAB — TROPONIN I (HIGH SENSITIVITY)
Troponin I (High Sensitivity): 15 ng/L (ref <18)
Troponin I (High Sensitivity): 16 ng/L (ref <18)

## 2024-03-17 LAB — CBG MONITORING, ED: Glucose-Capillary: 235 mg/dL — ABNORMAL HIGH (ref 70–99)

## 2024-03-17 LAB — LIPASE, BLOOD: Lipase: 36 U/L (ref 11–51)

## 2024-03-17 NOTE — ED Triage Notes (Signed)
 The pa has requested the pts acuity be increased to a 2  not a 3

## 2024-03-17 NOTE — ED Triage Notes (Signed)
 He has been out of his metformin  and his bp meds for 6 months  [facility]

## 2024-03-17 NOTE — ED Provider Triage Note (Signed)
 Emergency Medicine Provider Triage Evaluation Note  Kahner Yanik , a 58 y.o. male  was evaluated in triage.  Pt complains of a few episodes of chest tightness/pressure, non radiating, associated with SOB and nausea. Also has had right low back pain for 3 weeks, constant but worse with positional changes. No red flags.   Review of Systems  Positive: CP, mild headache, HTN Negative: Fever  Physical Exam  BP (!) 212/134   Pulse 98   Temp (!) 97.3 F (36.3 C)   Resp 18   Ht 5' 6 (1.676 m)   Wt 70.8 kg   SpO2 99%   BMI 25.19 kg/m  Gen:   Awake, no distress   Resp:  Normal effort  MSK:   Moves extremities without difficulty  Other:    Medical Decision Making  Medically screening exam initiated at 7:18 PM.  Appropriate orders placed.  Halford Goetzke was informed that the remainder of the evaluation will be completed by another provider, this initial triage assessment does not replace that evaluation, and the importance of remaining in the ED until their evaluation is complete.     Shermon Warren SAILOR, PA-C 03/17/24 1920

## 2024-03-17 NOTE — ED Triage Notes (Signed)
 The pt is c/o rt flank pain headache  dizziness  since 1400  no urinary symptoms   c/o high bp also

## 2024-03-18 ENCOUNTER — Other Ambulatory Visit (HOSPITAL_COMMUNITY): Payer: Self-pay

## 2024-03-18 ENCOUNTER — Encounter (HOSPITAL_COMMUNITY): Payer: Self-pay | Admitting: Family Medicine

## 2024-03-18 ENCOUNTER — Emergency Department (HOSPITAL_COMMUNITY)

## 2024-03-18 ENCOUNTER — Other Ambulatory Visit: Payer: Self-pay | Admitting: Physician Assistant

## 2024-03-18 ENCOUNTER — Observation Stay (HOSPITAL_COMMUNITY)

## 2024-03-18 DIAGNOSIS — I160 Hypertensive urgency: Secondary | ICD-10-CM

## 2024-03-18 DIAGNOSIS — R072 Precordial pain: Secondary | ICD-10-CM

## 2024-03-18 DIAGNOSIS — R079 Chest pain, unspecified: Secondary | ICD-10-CM

## 2024-03-18 DIAGNOSIS — E785 Hyperlipidemia, unspecified: Secondary | ICD-10-CM

## 2024-03-18 LAB — CBC
HCT: 46 % (ref 39.0–52.0)
HCT: 46.1 % (ref 39.0–52.0)
Hemoglobin: 15.7 g/dL (ref 13.0–17.0)
Hemoglobin: 16.1 g/dL (ref 13.0–17.0)
MCH: 30.3 pg (ref 26.0–34.0)
MCH: 30.6 pg (ref 26.0–34.0)
MCHC: 34.1 g/dL (ref 30.0–36.0)
MCHC: 34.9 g/dL (ref 30.0–36.0)
MCV: 88.6 fL (ref 80.0–100.0)
MCV: 87.5 fL (ref 80.0–100.0)
Platelets: 254 K/uL (ref 150–400)
Platelets: 296 K/uL (ref 150–400)
RBC: 5.19 MIL/uL (ref 4.22–5.81)
RBC: 5.27 MIL/uL (ref 4.22–5.81)
RDW: 12.1 % (ref 11.5–15.5)
RDW: 12.2 % (ref 11.5–15.5)
WBC: 6 K/uL (ref 4.0–10.5)
WBC: 6.4 K/uL (ref 4.0–10.5)
nRBC: 0 % (ref 0.0–0.2)
nRBC: 0 % (ref 0.0–0.2)

## 2024-03-18 LAB — GLUCOSE, CAPILLARY
Glucose-Capillary: 387 mg/dL — ABNORMAL HIGH (ref 70–99)
Glucose-Capillary: 231 mg/dL — ABNORMAL HIGH (ref 70–99)
Glucose-Capillary: 143 mg/dL — ABNORMAL HIGH (ref 70–99)
Glucose-Capillary: 195 mg/dL — ABNORMAL HIGH (ref 70–99)

## 2024-03-18 LAB — BASIC METABOLIC PANEL WITH GFR
Anion gap: 12 (ref 5–15)
BUN: 14 mg/dL (ref 6–20)
CO2: 21 mmol/L — ABNORMAL LOW (ref 22–32)
Calcium: 8.6 mg/dL — ABNORMAL LOW (ref 8.9–10.3)
Chloride: 98 mmol/L (ref 98–111)
Creatinine, Ser: 1.11 mg/dL (ref 0.61–1.24)
GFR, Estimated: >60 mL/min (ref >60)
Glucose, Bld: 269 mg/dL — ABNORMAL HIGH (ref 70–99)
Potassium: 4 mmol/L (ref 3.5–5.1)
Sodium: 131 mmol/L — ABNORMAL LOW (ref 135–145)

## 2024-03-18 LAB — ECHOCARDIOGRAM COMPLETE
AR max vel: 2.76 cm2
AV Area VTI: 3.04 cm2
AV Area mean vel: 2.07 cm2
AV Mean grad: 4 mmHg
AV Peak grad: 7.7 mmHg
Ao pk vel: 1.39 m/s
Area-P 1/2: 2.62 cm2
Calc EF: 66.4 %
Height: 67 in
MV VTI: 3.24 cm2
S' Lateral: 3.3 cm
Single Plane A2C EF: 69.7 %
Single Plane A4C EF: 62.4 %
Weight: 2828.94 [oz_av]

## 2024-03-18 LAB — LIPID PANEL
Cholesterol: 148 mg/dL (ref 0–200)
HDL: 60 mg/dL (ref >40)
LDL Cholesterol: 72 mg/dL (ref 0–99)
Total CHOL/HDL Ratio: 2.5 ratio
Triglycerides: 81 mg/dL (ref <150)
VLDL: 16 mg/dL (ref 0–40)

## 2024-03-18 LAB — TSH: TSH: 2.916 u[IU]/mL (ref 0.350–4.500)

## 2024-03-18 LAB — MAGNESIUM: Magnesium: 1.9 mg/dL (ref 1.7–2.4)

## 2024-03-18 LAB — CREATININE, SERUM
Creatinine, Ser: 1.06 mg/dL (ref 0.61–1.24)
GFR, Estimated: >60 mL/min (ref >60)

## 2024-03-18 LAB — PHOSPHORUS: Phosphorus: 3.1 mg/dL (ref 2.5–4.6)

## 2024-03-18 LAB — HEMOGLOBIN A1C
Hgb A1c MFr Bld: 9.5 % — ABNORMAL HIGH (ref 4.8–5.6)
Mean Plasma Glucose: 225.95 mg/dL

## 2024-03-18 LAB — HIV ANTIBODY (ROUTINE TESTING W REFLEX): HIV Screen 4th Generation wRfx: NONREACTIVE

## 2024-03-18 MED ORDER — INSULIN ASPART 100 UNIT/ML IJ SOLN: 0.0000 [IU] | Freq: Every day | INTRAMUSCULAR | Status: DC

## 2024-03-18 MED ORDER — INSULIN ASPART 100 UNIT/ML IJ SOLN
0.0000 [IU] | Freq: Three times a day (TID) | INTRAMUSCULAR | Status: DC
  Administered 2024-03-18: 15 [IU] via SUBCUTANEOUS
  Administered 2024-03-18: 2 [IU] via SUBCUTANEOUS
  Administered 2024-03-19: 11 [IU] via SUBCUTANEOUS

## 2024-03-18 MED ORDER — INFLUENZA VIRUS VACC SPLIT PF (FLUZONE) 0.5 ML IM SUSY: 0.5000 mL | PREFILLED_SYRINGE | INTRAMUSCULAR | Status: DC

## 2024-03-18 MED ORDER — ATORVASTATIN CALCIUM 20 MG PO TABS
20.0000 mg | ORAL_TABLET | Freq: Every day | ORAL | 0 refills | Status: AC
  Filled 2024-03-18: qty 30, 30d supply, fill #0

## 2024-03-18 MED ORDER — BLOOD GLUCOSE MONITOR SYSTEM W/DEVICE KIT
1.0000 | PACK | Freq: Three times a day (TID) | 0 refills | Status: DC
  Filled 2024-03-18: qty 1, 30d supply, fill #0

## 2024-03-18 MED ORDER — FLUOXETINE HCL 20 MG PO CAPS: 20.0000 mg | ORAL_CAPSULE | Freq: Every day | ORAL | 0 refills | Status: DC

## 2024-03-18 MED ORDER — LOSARTAN POTASSIUM 50 MG PO TABS
50.0000 mg | ORAL_TABLET | Freq: Once | ORAL | Status: AC
  Administered 2024-03-18: 50 mg via ORAL
  Filled 2024-03-18: qty 1

## 2024-03-18 MED ORDER — TRAZODONE HCL 50 MG PO TABS
100.0000 mg | ORAL_TABLET | Freq: Every evening | ORAL | Status: DC | PRN
  Administered 2024-03-18: 100 mg via ORAL
  Filled 2024-03-18: qty 2

## 2024-03-18 MED ORDER — LANCET DEVICE MISC
1.0000 | Freq: Three times a day (TID) | 0 refills | Status: AC
  Filled 2024-03-18: qty 1, fill #0

## 2024-03-18 MED ORDER — LABETALOL HCL 5 MG/ML IV SOLN: 10.0000 mg | INTRAVENOUS | Status: DC | PRN

## 2024-03-18 MED ORDER — METFORMIN HCL 500 MG PO TABS
500.0000 mg | ORAL_TABLET | Freq: Once | ORAL | Status: AC
  Administered 2024-03-18: 500 mg via ORAL
  Filled 2024-03-18: qty 1

## 2024-03-18 MED ORDER — NITROGLYCERIN 0.4 MG SL SUBL: 0.4000 mg | SUBLINGUAL_TABLET | SUBLINGUAL | Status: DC | PRN

## 2024-03-18 MED ORDER — INSULIN ASPART 100 UNIT/ML IJ SOLN: 0.0000 [IU] | Freq: Three times a day (TID) | INTRAMUSCULAR | Status: DC

## 2024-03-18 MED ORDER — ACETAMINOPHEN 325 MG PO TABS: 650.0000 mg | ORAL_TABLET | Freq: Four times a day (QID) | ORAL | Status: DC | PRN

## 2024-03-18 MED ORDER — BLOOD GLUCOSE TEST VI STRP
1.0000 | ORAL_STRIP | Freq: Three times a day (TID) | 0 refills | Status: AC
  Filled 2024-03-18: qty 100, 34d supply, fill #0

## 2024-03-18 MED ORDER — ACETAMINOPHEN 650 MG RE SUPP: 650.0000 mg | Freq: Four times a day (QID) | RECTAL | Status: DC | PRN

## 2024-03-18 MED ORDER — NITROGLYCERIN 2 % TD OINT: 0.5000 [in_us] | TOPICAL_OINTMENT | Freq: Four times a day (QID) | TRANSDERMAL | Status: DC | PRN

## 2024-03-18 MED ORDER — FLUOXETINE HCL 20 MG PO CAPS
20.0000 mg | ORAL_CAPSULE | Freq: Every day | ORAL | Status: DC
  Administered 2024-03-18 – 2024-03-19 (×2): 20 mg via ORAL
  Filled 2024-03-18 (×2): qty 1

## 2024-03-18 MED ORDER — GLIPIZIDE 5 MG PO TABS
5.0000 mg | ORAL_TABLET | Freq: Every day | ORAL | 0 refills | Status: AC
  Filled 2024-03-18: qty 30, 30d supply, fill #0

## 2024-03-18 MED ORDER — PRAZOSIN HCL 2 MG PO CAPS: 2.0000 mg | ORAL_CAPSULE | Freq: Every day | ORAL | Status: DC

## 2024-03-18 MED ORDER — PRAZOSIN HCL 2 MG PO CAPS
2.0000 mg | ORAL_CAPSULE | Freq: Every day | ORAL | 0 refills | Status: AC
  Filled 2024-03-18: qty 30, 30d supply, fill #0

## 2024-03-18 MED ORDER — METFORMIN HCL 500 MG PO TABS
500.0000 mg | ORAL_TABLET | Freq: Two times a day (BID) | ORAL | 0 refills | Status: AC
  Filled 2024-03-18: qty 60, 30d supply, fill #0

## 2024-03-18 MED ORDER — IOHEXOL 350 MG/ML SOLN
75.0000 mL | Freq: Once | INTRAVENOUS | Status: AC | PRN
  Administered 2024-03-18: 75 mL via INTRAVENOUS

## 2024-03-18 MED ORDER — IOHEXOL 9 MG/ML PO SOLN
ORAL | Status: AC
Start: 2024-03-18 — End: 2024-03-18
  Filled 2024-03-18: qty 1000

## 2024-03-18 MED ORDER — ASPIRIN 81 MG PO TBEC
81.0000 mg | DELAYED_RELEASE_TABLET | Freq: Every day | ORAL | Status: DC
  Administered 2024-03-19: 81 mg via ORAL
  Filled 2024-03-18: qty 1

## 2024-03-18 MED ORDER — ATORVASTATIN CALCIUM 40 MG PO TABS
40.0000 mg | ORAL_TABLET | Freq: Every day | ORAL | Status: DC
  Administered 2024-03-18: 40 mg via ORAL
  Filled 2024-03-18: qty 1

## 2024-03-18 MED ORDER — INFLUENZA VIRUS VACC SPLIT PF (FLUZONE) 0.5 ML IM SUSY
0.5000 mL | PREFILLED_SYRINGE | INTRAMUSCULAR | Status: AC
  Administered 2024-03-18: 0.5 mL via INTRAMUSCULAR
  Filled 2024-03-18: qty 0.5

## 2024-03-18 MED ORDER — ASPIRIN 81 MG PO CHEW
324.0000 mg | CHEWABLE_TABLET | Freq: Once | ORAL | Status: AC
  Administered 2024-03-18: 324 mg via ORAL
  Filled 2024-03-18: qty 4

## 2024-03-18 MED ORDER — PRAZOSIN HCL 2 MG PO CAPS
2.0000 mg | ORAL_CAPSULE | Freq: Once | ORAL | Status: AC
  Administered 2024-03-18: 2 mg via ORAL
  Filled 2024-03-18: qty 1

## 2024-03-18 MED ORDER — ACETAMINOPHEN 325 MG PO TABS
650.0000 mg | ORAL_TABLET | Freq: Once | ORAL | Status: AC
  Administered 2024-03-18: 650 mg via ORAL
  Filled 2024-03-18: qty 2

## 2024-03-18 MED ORDER — LOSARTAN POTASSIUM 50 MG PO TABS
50.0000 mg | ORAL_TABLET | Freq: Every day | ORAL | 0 refills | Status: AC
  Filled 2024-03-18: qty 30, 30d supply, fill #0

## 2024-03-18 MED ORDER — DICLOFENAC SODIUM 1 % EX GEL
2.0000 g | Freq: Four times a day (QID) | CUTANEOUS | Status: DC
  Administered 2024-03-18 – 2024-03-19 (×4): 2 g via TOPICAL
  Filled 2024-03-18: qty 100

## 2024-03-18 MED ORDER — LOSARTAN POTASSIUM 50 MG PO TABS
50.0000 mg | ORAL_TABLET | Freq: Every day | ORAL | Status: DC
  Administered 2024-03-18 – 2024-03-19 (×2): 50 mg via ORAL
  Filled 2024-03-18 (×2): qty 1

## 2024-03-18 MED ORDER — IOHEXOL 12 MG/ML PO SOLN
500.0000 mL | ORAL | Status: AC
  Administered 2024-03-18 (×2): 500 mL via ORAL

## 2024-03-18 MED ORDER — ENOXAPARIN SODIUM 40 MG/0.4ML IJ SOSY
40.0000 mg | PREFILLED_SYRINGE | INTRAMUSCULAR | Status: DC
  Administered 2024-03-18 – 2024-03-19 (×2): 40 mg via SUBCUTANEOUS
  Filled 2024-03-18 (×2): qty 0.4

## 2024-03-18 MED ORDER — PNEUMOCOCCAL 20-VAL CONJ VACC 0.5 ML IM SUSY
0.5000 mL | PREFILLED_SYRINGE | INTRAMUSCULAR | Status: DC
  Filled 2024-03-18: qty 0.5

## 2024-03-18 MED ORDER — ASPIRIN 81 MG PO TBEC
81.0000 mg | DELAYED_RELEASE_TABLET | Freq: Every day | ORAL | 0 refills | Status: AC
  Filled 2024-03-18: qty 30, 30d supply, fill #0

## 2024-03-18 MED ORDER — DAPAGLIFLOZIN PROPANEDIOL 10 MG PO TABS
10.0000 mg | ORAL_TABLET | Freq: Every day | ORAL | 0 refills | Status: DC
  Filled 2024-03-18: qty 90, 90d supply, fill #0

## 2024-03-18 NOTE — Progress Notes (Signed)
 I was contacted by Dr. Perri to discuss imaging findings concerning for sigmoid volvulus. Patient was admitted with chest pain, and initial non-contrast CT was read as possible sigmoid volvulus. Patient has not had clinical symptoms of volvulus. I reviewed his CT and ordered a repeat CT with IV and rectal contrast. Follow up scan was performed with IV and oral contrast but no rectal contrast, however there are no signs of volvulus or colonic obstruction on the follow up CT. I discussed with the reading radiologist to confirm this. No indication for surgical intervention.  Leonor Dawn, MD [facility] Surgery General, Hepatobiliary and Pancreatic Surgery 03/18/24 2:32 PM

## 2024-03-18 NOTE — Discharge Summary (Signed)
 Physician Discharge Summary  Dave Moran FMW:[id number] DOB: 1966/03/19 DOA: 03/17/2024  PCP: [facility]  Admit date: 03/17/2024 Discharge date: 03/18/2024  Time spent: 40 minutes  Recommendations for Outpatient Follow-up:  Follow outpatient CBC/CMP  Follow with cards outpatient for coronary CT Follow abnormal echo outpatient  Follow hypertension outpatient - adjust regimen as necessary Follow diabetes/hyperglycemia outpatient - adjust regimen as needed Msk R flank pain, follow with trial of topical meds (voltaren ) Follow proteinuria outpatient Follow hepatic steatosis outpatient Follow LDL - needs repeat labs in 8 weeks (repeat LFT's) BPH on CT, follow for symptoms outpatient L adrenal mass - benign adenoma, follow outpatient   Discharge Diagnoses:  Principal Problem:   Nonspecific chest pain   Discharge Condition: stable  Diet recommendation: diabetic  Filed Weights   03/17/24 1841 03/18/24 0559  Weight: 70.8 kg 80.2 kg    History of present illness:   Dave Moran is Dave Moran 58 y.o. male with medical history significant of hypertension, T2DM, cholecystectomy, depression and anxiety presenting with concern for chest pain.   His symptoms have improved with resumption of his home meds.  Cards planning for outpatient coronary CT.    See below and previous notes for additional details   Hospital Course:  Assessment and Plan:  Chest Pain Negative trop x2.   Echo with speckled appearance of myocardium - consider cardiac MRI - EF 60-65%, no RWMA, grade 1 diastolic dysfunction LDL 72, A1c 9.5 Tsh wnl Aspirin , statin Appreciate cardiology assistance - plan for outpatient coronary CTA, no plans for cardiac MRI at this time - follow outpatient    Hypertensive Urgency BP improved on losartan  and prazosin  Follow outpatient    Headache Now resolved Suspect due to hypertension above   Concern for Partial or Developing Sigmoid Volvulus Repeat CT scan without acute  findings - appreciate general surgery assistance   Right Sided Flank Pain  Seems musculoskeletal, trial voltaren  - follow outpatient   T2DM A1c 9.5  Start metformin , start farxiga  (discussed risks/benefits), start glipizide    Proteinuria Follow outpatient, quantify further - UP/C ordered, pending at discharge, can be followed outpatient   Depression  Anxiety prozac  Prazosin    BPH Denies LUTS - follow outpatient   Hepatic Steatosis Follow outpatient  Left Adrenal Mass Follow outpatient, c/w benign adenoma  He ran out of meds 4-6 months ago.  Wasn't taking any on admission.  It's important he follows with his outpatient doc for refills.     Procedures: Echo IMPRESSIONS     1. Myocardium with speckled appearance; consider cardiac MRI to R/O  amyloid.   2. Left ventricular ejection fraction, by estimation, is 60 to 65%. The  left ventricle has normal function. The left ventricle has no regional  wall motion abnormalities. There is severe left ventricular hypertrophy.  Left ventricular diastolic parameters   are consistent with Grade I diastolic dysfunction (impaired relaxation).  The average left ventricular global longitudinal strain is -20.4 %. The  global longitudinal strain is normal.   3. Right ventricular systolic function is normal. The right ventricular  size is normal.   4. The mitral valve is normal in structure. No evidence of mitral valve  regurgitation. No evidence of mitral stenosis.   5. The aortic valve is tricuspid. Aortic valve regurgitation is not  visualized. No aortic stenosis is present.   6. The inferior vena cava is normal in size with greater than 50%  respiratory variability, suggesting right atrial pressure of 3 mmHg.   Comparison(s): No prior Echocardiogram.  Consultations: cardiology  Discharge Exam: Vitals:   03/18/24 0839 03/18/24 1242  BP: (!) 145/70 (!) 149/82  Pulse: 70 84  Resp: 17 17  Temp: 98.1 F (36.7 C) 98.9 F  (37.2 C)  SpO2: 96% 100%   Discussed discharge plan  He wants to make sure he can get his meds See physical from [facility]  Discharge Instructions   Discharge Instructions     Call MD for:  difficulty breathing, headache or visual disturbances   Complete by: As directed    Call MD for:  extreme fatigue   Complete by: As directed    Call MD for:  hives   Complete by: As directed    Call MD for:  persistant dizziness or light-headedness   Complete by: As directed    Call MD for:  persistant nausea and vomiting   Complete by: As directed    Call MD for:  redness, tenderness, or signs of infection (pain, swelling, redness, odor or green/yellow discharge around incision site)   Complete by: As directed    Call MD for:  severe uncontrolled pain   Complete by: As directed    Call MD for:  temperature >100.4   Complete by: As directed    Diet - low sodium heart healthy   Complete by: As directed    Discharge instructions   Complete by: As directed    You were seen for high blood pressure and chest pain.  Cardiology had arranged an outpatient study for you to evaluate you coronary vasculature.  I've refilled your losartan  and prazosin .  Continue these and follow your blood pressure trends with your outpatient doctors.   I'm refilling your medicine for your diabetes.  We'll start you on metformin .  Take 500 mg with breakfast daily for 7 days, if you tolerate this well, increase to 500 mg twice Ayanni Tun day with breakfast and dinner.  We'll resume your glipizide  and we'll start you on Selma Rodelo new medicine called farxiga .    Your CT of your abdomen showed some abnormal findings.  You have fatty liver disease.  Follow this with your outpatient doctor.  Marshelle Bilger healthy diet, maintaining Corneilus Heggie healthy weight, and exercise are important.  You have Sasha Rueth stable left adrenal mass that can be followed with your outpatient doctor.  Your prostate gland is also mildly enlarged and can be followed outpatient.    Your back/hip  pain seems musculoskeletal.  Trial voltaren  for this.    I'll refill your medicine for depression and anxiety (prozac ).  Follow with your outpatient doctor for refills.   I'll refill your medicine for cholesterol.  Follow with your outpatient doctor.   Return for new, recurrent, or worsening symptoms.  Please ask your PCP to request records from this hospitalization so they know what was done and what the next steps will be.   Increase activity slowly   Complete by: As directed       Allergies as of 03/18/2024   No Known Allergies      Medication List     STOP taking these medications    busPIRone 5 MG tablet Commonly known as: BUSPAR   dicyclomine  20 MG tablet Commonly known as: BENTYL    gabapentin  300 MG capsule Commonly known as: NEURONTIN    hydrOXYzine  25 MG tablet Commonly known as: ATARAX    ibuprofen  200 MG tablet Commonly known as: ADVIL    pantoprazole  40 MG tablet Commonly known as: PROTONIX    polyethylene glycol 17 g packet Commonly known as: MIRALAX  /  GLYCOLAX    propranolol  40 MG tablet Commonly known as: INDERAL    traZODone  100 MG tablet Commonly known as: DESYREL        TAKE these medications    acetaminophen  500 MG tablet Commonly known as: TYLENOL  Take 500 mg by mouth every 6 (six) hours as needed for mild pain (pain score 1-3).   aspirin  EC 81 MG tablet Take 1 tablet (81 mg total) by mouth daily. Swallow whole. Start taking on: March 19, 2024   atorvastatin  20 MG tablet Commonly known as: LIPITOR Take 1 tablet (20 mg total) by mouth daily. What changed:  medication strength how much to take when to take this   dapagliflozin  propanediol 10 MG Tabs tablet Commonly known as: FARXIGA  Take 1 tablet (10 mg total) by mouth daily.   FLUoxetine  20 MG capsule Commonly known as: PROZAC  Take 1 capsule (20 mg total) by mouth daily. Follow with your PCP for refills What changed: additional instructions   glipiZIDE  5 MG tablet Commonly  known as: GLUCOTROL  Take 1 tablet (5 mg total) by mouth daily before breakfast. Follow with your outpatient PCP for adjustments to your regimen What changed:  when to take this additional instructions   losartan  50 MG tablet Commonly known as: COZAAR  Take 1 tablet (50 mg total) by mouth daily.   metFORMIN  500 MG tablet Commonly known as: GLUCOPHAGE  Take 1 tablet (500 mg total) by mouth 2 (two) times daily with Jesilyn Easom meal. Titrate as instructed in discharge instructions What changed: additional instructions   prazosin  2 MG capsule Commonly known as: MINIPRESS  Take 1 capsule (2 mg total) by mouth at bedtime. Start taking on: March 19, 2024 What changed: how much to take       No Known Allergies    The results of significant diagnostics from this hospitalization (including imaging, microbiology, ancillary and laboratory) are listed below for reference.    Significant Diagnostic Studies: CT ABDOMEN PELVIS W CONTRAST Result Date: 03/18/2024 CLINICAL DATA:  Flank pain. EXAM: CT ABDOMEN AND PELVIS WITH CONTRAST TECHNIQUE: Multidetector CT imaging of the abdomen and pelvis was performed using the standard protocol following bolus administration of intravenous contrast. RADIATION DOSE REDUCTION: This exam was performed according to the departmental dose-optimization program which includes automated exposure control, adjustment of the mA and/or kV according to patient size and/or use of iterative reconstruction technique. CONTRAST:  75mL OMNIPAQUE  IOHEXOL  350 MG/ML SOLN COMPARISON:  March 18, 2024 FINDINGS: Lower chest: No acute abnormality. Hepatobiliary: There is diffuse fatty infiltration of the liver parenchyma. No focal liver abnormality is seen. Status post cholecystectomy. No biliary dilatation. Pancreas: Unremarkable. No pancreatic ductal dilatation or surrounding inflammatory changes. Spleen: Normal in size without focal abnormality. Adrenals/Urinary Tract: The right adrenal gland is  unremarkable. Neill Jurewicz stable 14 mm left adrenal mass (approximately 86.45 Hounsfield units) is seen (axial CT image 30, CT series 4). Kidneys are normal in size, without renal calculi or hydronephrosis. Deriona Altemose 10 mm diameter simple cyst is seen within the posterolateral aspect of the mid right kidney. There is mild diffuse urinary bladder wall thickening. Stomach/Bowel: Stomach is within normal limits. Appendix appears normal. No evidence of bowel wall thickening, distention, or inflammatory changes. Vascular/Lymphatic: No significant vascular findings are present. No enlarged abdominal or pelvic lymph nodes. Reproductive: The prostate gland is mildly enlarged. Other: No abdominal wall hernia or abnormality. No abdominopelvic ascites. Musculoskeletal: No acute or significant osseous findings. IMPRESSION: 1. Hepatic steatosis. 2. Evidence of prior cholecystectomy. 3. Stable left adrenal mass, likely consistent with Tonjia Parillo  benign adenoma. 4. Mild diffuse urinary bladder wall thickening which may be secondary to cystitis. Correlation with urinalysis is recommended. 5. Mildly enlarged prostate gland. Electronically Signed   By: Suzen Dials M.D.   On: 03/18/2024 13:54   ECHOCARDIOGRAM COMPLETE Result Date: 03/18/2024    ECHOCARDIOGRAM REPORT   Patient Name:   Dave Moran Date of Exam: 03/18/2024 Medical Rec #:  [id number]     Height:       67.0 in Accession #:    [id number]    Weight:       176.8 lb Date of Birth:  02-20-1966     BSA:          1.919 m Patient Age:    58 years      BP:           106/67 mmHg Patient Gender: M             HR:           70 bpm. Exam Location:  Inpatient Procedure: 2D Echo, Color Doppler, Limited Color Doppler and Strain Analysis            (Both Spectral and Color Flow Doppler were utilized during            procedure). Indications:    Chest Pain R07.9  History:        Patient has no prior history of Echocardiogram examinations.                 Signs/Symptoms:Chest Pain; Risk Factors:Hypertension and                  Diabetes.  Sonographer:    BERNARDA ROCKS Referring Phys: Chanika Byland CALDWELL POWELL JR IMPRESSIONS  1. Myocardium with speckled appearance; consider cardiac MRI to R/O amyloid.  2. Left ventricular ejection fraction, by estimation, is 60 to 65%. The left ventricle has normal function. The left ventricle has no regional wall motion abnormalities. There is severe left ventricular hypertrophy. Left ventricular diastolic parameters  are consistent with Grade I diastolic dysfunction (impaired relaxation). The average left ventricular global longitudinal strain is -20.4 %. The global longitudinal strain is normal.  3. Right ventricular systolic function is normal. The right ventricular size is normal.  4. The mitral valve is normal in structure. No evidence of mitral valve regurgitation. No evidence of mitral stenosis.  5. The aortic valve is tricuspid. Aortic valve regurgitation is not visualized. No aortic stenosis is present.  6. The inferior vena cava is normal in size with greater than 50% respiratory variability, suggesting right atrial pressure of 3 mmHg. Comparison(s): No prior Echocardiogram. FINDINGS  Left Ventricle: Left ventricular ejection fraction, by estimation, is 60 to 65%. The left ventricle has normal function. The left ventricle has no regional wall motion abnormalities. The average left ventricular global longitudinal strain is -20.4 %. Strain was performed and the global longitudinal strain is normal. The left ventricular internal cavity size was normal in size. There is severe left ventricular hypertrophy. Left ventricular diastolic parameters are consistent with Grade I diastolic dysfunction (impaired relaxation). Right Ventricle: The right ventricular size is normal. Right ventricular systolic function is normal. Left Atrium: Left atrial size was normal in size. Right Atrium: Right atrial size was normal in size. Pericardium: There is no evidence of pericardial effusion. Mitral Valve: The  mitral valve is normal in structure. No evidence of mitral valve regurgitation. No evidence of mitral valve stenosis. MV peak gradient, 3.0 mmHg. The mean mitral valve  gradient is 1.0 mmHg. Tricuspid Valve: The tricuspid valve is normal in structure. Tricuspid valve regurgitation is not demonstrated. No evidence of tricuspid stenosis. Aortic Valve: The aortic valve is tricuspid. Aortic valve regurgitation is not visualized. No aortic stenosis is present. Aortic valve mean gradient measures 4.0 mmHg. Aortic valve peak gradient measures 7.7 mmHg. Aortic valve area, by VTI measures 3.04 cm. Pulmonic Valve: The pulmonic valve was normal in structure. Pulmonic valve regurgitation is not visualized. No evidence of pulmonic stenosis. Aorta: The aortic root is normal in size and structure. Venous: The inferior vena cava is normal in size with greater than 50% respiratory variability, suggesting right atrial pressure of 3 mmHg. IAS/Shunts: No atrial level shunt detected by color flow Doppler. Additional Comments: Myocardium with speckled appearance; consider cardiac MRI to R/O amyloid.  LEFT VENTRICLE PLAX 2D LVIDd:         4.90 cm      Diastology LVIDs:         3.30 cm      LV e' medial:    5.77 cm/s LV PW:         1.20 cm      LV E/e' medial:  11.3 LV IVS:        1.20 cm      LV e' lateral:   10.00 cm/s LVOT diam:     2.20 cm      LV E/e' lateral: 6.5 LV SV:         80 LV SV Index:   42           2D Longitudinal Strain LVOT Area:     3.80 cm     2D Strain GLS Avg:     -20.4 %  LV Volumes (MOD) LV vol d, MOD A2C: 130.0 ml LV vol d, MOD A4C: 175.0 ml LV vol s, MOD A2C: 39.4 ml LV vol s, MOD A4C: 65.8 ml LV SV MOD A2C:     90.6 ml LV SV MOD A4C:     175.0 ml LV SV MOD BP:      104.1 ml RIGHT VENTRICLE             IVC RV Basal diam:  3.90 cm     IVC diam: 1.60 cm RV S prime:     14.40 cm/s TAPSE (M-mode): 1.8 cm LEFT ATRIUM             Index        RIGHT ATRIUM           Index LA diam:        4.10 cm 2.14 cm/m   RA Area:      14.10 cm LA Vol (A2C):   34.7 ml 18.08 ml/m  RA Volume:   32.20 ml  16.78 ml/m LA Vol (A4C):   53.4 ml 27.83 ml/m LA Biplane Vol: 47.4 ml 24.70 ml/m  AORTIC VALVE                    PULMONIC VALVE AV Area (Vmax):    2.76 cm     PV Vmax:       0.99 m/s AV Area (Vmean):   2.07 cm     PV Peak grad:  3.9 mmHg AV Area (VTI):     3.04 cm AV Vmax:           139.00 cm/s AV Vmean:          91.300 cm/s AV VTI:  0.263 m AV Peak Grad:      7.7 mmHg AV Mean Grad:      4.0 mmHg LVOT Vmax:         101.00 cm/s LVOT Vmean:        49.800 cm/s LVOT VTI:          0.210 m LVOT/AV VTI ratio: 0.80  AORTA Ao Root diam: 3.20 cm Ao Asc diam:  3.50 cm MITRAL VALVE MV Area (PHT): 2.62 cm    SHUNTS MV Area VTI:   3.24 cm    Systemic VTI:  0.21 m MV Peak grad:  3.0 mmHg    Systemic Diam: 2.20 cm MV Mean grad:  1.0 mmHg MV Vmax:       0.87 m/s MV Vmean:      52.1 cm/s MV Decel Time: 289 msec MV E velocity: 65.00 cm/s MV Auri Jahnke velocity: 87.50 cm/s MV E/Marita Burnsed ratio:  0.74 Redell Shallow MD Electronically signed by Redell Shallow MD Signature Date/Time: 03/18/2024/10:32:19 AM    Final    DG Hip Unilat W or Wo Pelvis 2-3 Views Right Result Date: 03/18/2024 CLINICAL DATA:  Right hip pain. EXAM: DG HIP (WITH OR WITHOUT PELVIS) 2-3V RIGHT COMPARISON:  CT abdomen pelvis, and reconstructions 11/23/2018 FINDINGS: There is no evidence of hip fracture or dislocation. There is no evidence of arthropathy or other focal bone abnormality. There are bilateral pelvic surgical clips. IMPRESSION: No evidence of fracture or dislocation. Bilateral pelvic surgical clips. Electronically Signed   By: Francis Quam M.D.   On: 03/18/2024 02:38   CT Renal Stone Study Result Date: 03/18/2024 EXAM: CT UROGRAM 03/18/2024 01:58:49 AM TECHNIQUE: CT of the abdomen and pelvis was performed without the administration of intravenous contrast. COMPARISON: None available. CLINICAL HISTORY: Abdominal/flank pain, stone suspected. Rt flank pain FINDINGS: LOWER CHEST: No  acute abnormality. LIVER: The liver is unremarkable. GALLBLADDER AND BILE DUCTS: Status post cholecystectomy. SPLEEN: No acute abnormality. PANCREAS: No acute abnormality. ADRENAL GLANDS: No acute abnormality. KIDNEYS, URETERS AND BLADDER: No stones in the kidneys or ureters. No hydronephrosis. No perinephric or periureteral stranding. Urinary bladder is unremarkable. GI AND BOWEL: No bowel wall thickening. Normal appendix. Stomach is within normal limits. Redundant sigmoid colon with reversed configuration compared to CT Nov 23, 2018. There is narrowing of the distal descending colon with associated vascular swirling (series 7, image [id number]). Findings may be due to partial or developing volvulus. Thickening or pneumatosis to suggest ischemia. Surgical consult recommended. PERITONEUM AND RETROPERITONEUM: No ascites. No free air. VASCULATURE: Aorta is normal in caliber. LYMPH NODES: No lymphadenopathy. REPRODUCTIVE ORGANS: No acute abnormality. BONES AND SOFT TISSUES: No acute osseous abnormality. IMPRESSION: 1. Narrowing of the distal descending colon with associated vascular swirling, possibly due to partial or developing sigmoid volvulus. No bowel wall thickening or pneumatosis to suggest ischemia. Surgical consult recommended. Electronically signed by: Norman Gatlin MD 03/18/2024 02:37 AM EDT RP Workstation: [id number]   DG Chest 2 View Result Date: 03/17/2024 CLINICAL DATA:  Chest pain EXAM: CHEST - 2 VIEW COMPARISON:  07/09/2018 FINDINGS: The heart size and mediastinal contours are within normal limits. Both lungs are clear. The visualized skeletal structures are unremarkable. IMPRESSION: No active cardiopulmonary disease. Electronically Signed   By: Luke Bun M.D.   On: 03/17/2024 19:50    Microbiology: No results found for this or any previous visit (from the past 240 hours).   Labs: Basic Metabolic Panel: Recent Labs  Lab 03/17/24 1920 03/17/24 1925 03/18/24 0458 03/18/24 1242  NA 133*  134* 131*  --   K 4.4 4.5 4.0  --   CL 97* 99 98  --   CO2 24  --  21*  --   GLUCOSE 236* 232* 269*  --   BUN 12 13 14   --   CREATININE 0.97 1.00 1.11 1.06  CALCIUM  9.3  --  8.6*  --   MG  --   --  1.9  --   PHOS  --   --  3.1  --    Liver Function Tests: Recent Labs  Lab 03/17/24 1920  AST 25  ALT 35  ALKPHOS 77  BILITOT 0.6  PROT 7.3  ALBUMIN 4.0   Recent Labs  Lab 03/17/24 1920  LIPASE 36   No results for input(s): AMMONIA in the last 168 hours. CBC: Recent Labs  Lab 03/17/24 1920 03/17/24 1925 03/18/24 0458 03/18/24 1242  WBC 7.2  --  6.0 6.4  HGB 17.0 17.7* 15.7 16.1  HCT 49.7 52.0 46.0 46.1  MCV 88.9  --  88.6 87.5  PLT 309  --  254 296   Cardiac Enzymes: No results for input(s): CKTOTAL, CKMB, CKMBINDEX, TROPONINI in the last 168 hours. BNP: BNP (last 3 results) No results for input(s): BNP in the last 8760 hours.  ProBNP (last 3 results) No results for input(s): PROBNP in the last 8760 hours.  CBG: Recent Labs  Lab 03/17/24 1844 03/18/24 1035 03/18/24 1231  GLUCAP 235* 387* 231*       Signed:  Meliton Monte MD.  [facility] 03/18/2024, 3:38 PM

## 2024-03-18 NOTE — Plan of Care (Signed)

## 2024-03-18 NOTE — H&P (Signed)
 History and Physical    Dave Moran FMW:[id number] DOB: 02/06/1966 DOA: 03/17/2024  PCP: [facility], [facility]  Patient coming from: home  I have personally briefly reviewed patient's old medical records in [facility]  Chief Complaint: chest pain  HPI: Dave Moran is Dave Moran 58 y.o. male with medical history significant of hypertension, T2DM, cholecystectomy, depression and anxiety presenting with concern for chest pain.  He notes his symptoms started yesterday afternoon.  He felt like his blood pressure was up.  He felt this way due to chest pressure described as central, tightness.  Came and went.  6/10 in intensity.  Nothing made it better other than meds he received here (just says got better after he got pills here).  No CP with exertion.  In addition to the chest discomfort, he described Dave Moran throbbing frontal headache.  On/off during that period of time.  No vision changes.  He had associated nausea.  No fevers, chills, numbness, tingling.  No shortness of breath.  Smokes 1 ppw (at one time was smoking 2 ppd).  Drinks occasional beer.  Hasn't been to the [place] in 4-6 months, has been out of his meds for that long.  ED Course: Labs, imaging, EKG.  Admit for hypertension and chest pain.    Review of Systems: As per HPI otherwise all other systems reviewed and are negative.  Past Medical History:  Diagnosis Date   Chest pain 07/09/2018   Diabetes mellitus type 2, noninsulin dependent (HCC)    Essential hypertension    Hypertensive urgency    Hypokalemia    Kidney stone     Past Surgical History:  Procedure Laterality Date   CHOLECYSTECTOMY      Social History  reports that he has been smoking cigarettes. He has never used smokeless tobacco. He reports that he does not currently use alcohol. He reports that he does not currently use drugs.  No Known Allergies  History reviewed. No pertinent family history.  Prior to Admission medications   Medication Sig Start Date End  Date Taking? Authorizing Provider  acetaminophen  (TYLENOL ) 500 MG tablet Take 500 mg by mouth every 6 (six) hours as needed for mild pain (pain score 1-3).   Yes [provider]  busPIRone (BUSPAR) 5 MG tablet Take 5 mg by mouth daily.   Yes [provider]  ibuprofen  (ADVIL ) 200 MG tablet Take 400 mg by mouth every 6 (six) hours as needed for mild pain (pain score 1-3).   Yes [provider]  atorvastatin  (LIPITOR) 40 MG tablet Take 1 tablet (40 mg total) by mouth daily at 6 PM. Patient not taking: Reported on 03/18/2024 07/11/18   Dave Moran SQUIBB, MD  dicyclomine  (BENTYL ) 20 MG tablet Take 1 tablet (20 mg total) by mouth every 6 (six) hours as needed for spasms (abdominal cramping). Patient not taking: Reported on 03/18/2024 11/29/18   Dave Rogue, MD  FLUoxetine  (PROZAC ) 20 MG capsule Take 1 capsule (20 mg total) by mouth daily. Patient not taking: Reported on 03/18/2024 11/30/18   Dave Rogue, MD  gabapentin  (NEURONTIN ) 300 MG capsule Take 1 capsule (300 mg total) by mouth 3 (three) times daily. Patient not taking: Reported on 03/18/2024 11/29/18   Dave Rogue, MD  glipiZIDE  (GLUCOTROL ) 5 MG tablet Take 1 tablet (5 mg total) by mouth 2 (two) times daily before Dave Moran meal. Patient not taking: Reported on 03/18/2024 07/10/18 02/05/19  Dave Moran SQUIBB, MD  hydrOXYzine  (ATARAX /VISTARIL ) 25 MG tablet Take 1 tablet (25 mg  total) by mouth every 6 (six) hours as needed for anxiety. Patient not taking: Reported on 03/18/2024 11/29/18   Dave Clarita BRAVO, NP  losartan  (COZAAR ) 50 MG tablet Take 1 tablet (50 mg total) by mouth daily. Patient not taking: Reported on 03/18/2024 07/11/18   Dave Moran SQUIBB, MD  metFORMIN  (GLUCOPHAGE ) 500 MG tablet Take 1 tablet (500 mg total) by mouth 2 (two) times daily with Dave Moran meal. Patient not taking: Reported on 03/18/2024 07/10/18   Dave Moran SQUIBB, MD  pantoprazole  (PROTONIX ) 40 MG tablet Take 1 tablet (40 mg total) by mouth 2 (two) times daily. Patient  not taking: Reported on 03/18/2024 11/29/18   Dave Rogue, MD  polyethylene glycol (MIRALAX  / GLYCOLAX ) 17 g packet Take 17 g by mouth 2 (two) times daily. Patient not taking: Reported on 03/18/2024 02/05/19   Dean Clarity, MD  prazosin  (MINIPRESS ) 2 MG capsule Take 2 capsules (4 mg total) by mouth at bedtime. Patient not taking: Reported on 03/18/2024 11/29/18   Dave Rogue, MD  propranolol  (INDERAL ) 40 MG tablet Take 1 tablet (40 mg total) by mouth 3 (three) times daily. Patient not taking: Reported on 03/18/2024 11/29/18   Dave Rogue, MD  traZODone  (DESYREL ) 100 MG tablet Take 1 tablet (100 mg total) by mouth at bedtime as needed for sleep. Patient not taking: Reported on 03/18/2024 11/29/18   Dave Clarita BRAVO, NP    Physical Exam: Vitals:   03/17/24 2201 03/18/24 0253 03/18/24 0559 03/18/24 0839  BP: (!) 203/105 (!) 147/82 106/67 (!) 145/70  Pulse: 90 83 72 70  Resp: (!) 22 17 18 17   Temp: 98.4 F (36.9 C)  98.3 F (36.8 C) 98.1 F (36.7 C)  TempSrc:   Oral   SpO2: 98% 97% 98% 96%  Weight:   80.2 kg   Height:   5' 7 (1.702 m)     Constitutional: NAD, calm, comfortable Vitals:   03/17/24 2201 03/18/24 0253 03/18/24 0559 03/18/24 0839  BP: (!) 203/105 (!) 147/82 106/67 (!) 145/70  Pulse: 90 83 72 70  Resp: (!) 22 17 18 17   Temp: 98.4 F (36.9 C)  98.3 F (36.8 C) 98.1 F (36.7 C)  TempSrc:   Oral   SpO2: 98% 97% 98% 96%  Weight:   80.2 kg   Height:   5' 7 (1.702 m)    Eyes: PERRL ENMT: Mucous membranes are moist.  Neck: normal, supple Respiratory: clear to auscultation bilaterally, no wheezing, no crackles Cardiovascular: Regular rate and rhythm, no murmurs / rubs / gallops. No extremity edema.  Abdomen: no tenderness, no masses palpated. No hepatosplenomegaly. R sided lumbar paraspinal and flank pain Musculoskeletal: no clubbing / cyanosis. No joint deformity upper and lower extremities. Good ROM, no contractures. Normal muscle tone.  Skin: no rashes, lesions, ulcers. No  induration Neurologic: CN 2-12 grossly intact. Moving all extremities Psychiatric: Normal judgment and insight. Alert and oriented x 3. Normal mood.   Labs on Admission: I have personally reviewed following labs and imaging studies  CBC: Recent Labs  Lab 03/17/24 1920 03/17/24 1925 03/18/24 0458  WBC 7.2  --  6.0  HGB 17.0 17.7* 15.7  HCT 49.7 52.0 46.0  MCV 88.9  --  88.6  PLT 309  --  254    Basic Metabolic Panel: Recent Labs  Lab 03/17/24 1920 03/17/24 1925 03/18/24 0458  NA 133* 134* 131*  K 4.4 4.5 4.0  CL 97* 99 98  CO2 24  --  21*  GLUCOSE 236*  232* 269*  BUN 12 13 14   CREATININE 0.97 1.00 1.11  CALCIUM  9.3  --  8.6*  MG  --   --  1.9  PHOS  --   --  3.1    GFR: Estimated Creatinine Clearance: 74.5 mL/min (by C-G formula based on SCr of 1.11 mg/dL).  Liver Function Tests: Recent Labs  Lab 03/17/24 1920  AST 25  ALT 35  ALKPHOS 77  BILITOT 0.6  PROT 7.3  ALBUMIN 4.0    Urine analysis:    Component Value Date/Time   COLORURINE YELLOW 03/17/2024 2335   APPEARANCEUR HAZY (Jaiquan Temme) 03/17/2024 2335   LABSPEC 1.024 03/17/2024 2335   PHURINE 5.0 03/17/2024 2335   GLUCOSEU >=500 (Gurjot Brisco) 03/17/2024 2335   HGBUR SMALL (Jamire Shabazz) 03/17/2024 2335   BILIRUBINUR NEGATIVE 03/17/2024 2335   KETONESUR 20 (Katilynn Sinkler) 03/17/2024 2335   PROTEINUR 100 (Ekaterina Denise) 03/17/2024 2335   NITRITE NEGATIVE 03/17/2024 2335   LEUKOCYTESUR NEGATIVE 03/17/2024 2335    Radiological Exams on Admission: DG Hip Unilat W or Wo Pelvis 2-3 Views Right Result Date: 03/18/2024 CLINICAL DATA:  Right hip pain. EXAM: DG HIP (WITH OR WITHOUT PELVIS) 2-3V RIGHT COMPARISON:  CT abdomen pelvis, and reconstructions 11/23/2018 FINDINGS: There is no evidence of hip fracture or dislocation. There is no evidence of arthropathy or other focal bone abnormality. There are bilateral pelvic surgical clips. IMPRESSION: No evidence of fracture or dislocation. Bilateral pelvic surgical clips. Electronically Signed   By: Francis Quam  M.D.   On: 03/18/2024 02:38   CT Renal Stone Study Result Date: 03/18/2024 EXAM: CT UROGRAM 03/18/2024 01:58:49 AM TECHNIQUE: CT of the abdomen and pelvis was performed without the administration of intravenous contrast. COMPARISON: None available. CLINICAL HISTORY: Abdominal/flank pain, stone suspected. Rt flank pain FINDINGS: LOWER CHEST: No acute abnormality. LIVER: The liver is unremarkable. GALLBLADDER AND BILE DUCTS: Status post cholecystectomy. SPLEEN: No acute abnormality. PANCREAS: No acute abnormality. ADRENAL GLANDS: No acute abnormality. KIDNEYS, URETERS AND BLADDER: No stones in the kidneys or ureters. No hydronephrosis. No perinephric or periureteral stranding. Urinary bladder is unremarkable. GI AND BOWEL: No bowel wall thickening. Normal appendix. Stomach is within normal limits. Redundant sigmoid colon with reversed configuration compared to CT Nov 23, 2018. There is narrowing of the distal descending colon with associated vascular swirling (series 7, image [id number]). Findings may be due to partial or developing volvulus. Thickening or pneumatosis to suggest ischemia. Surgical consult recommended. PERITONEUM AND RETROPERITONEUM: No ascites. No free air. VASCULATURE: Aorta is normal in caliber. LYMPH NODES: No lymphadenopathy. REPRODUCTIVE ORGANS: No acute abnormality. BONES AND SOFT TISSUES: No acute osseous abnormality. IMPRESSION: 1. Narrowing of the distal descending colon with associated vascular swirling, possibly due to partial or developing sigmoid volvulus. No bowel wall thickening or pneumatosis to suggest ischemia. Surgical consult recommended. Electronically signed by: Norman Gatlin MD 03/18/2024 02:37 AM EDT RP Workstation: [id number]   DG Chest 2 View Result Date: 03/17/2024 CLINICAL DATA:  Chest pain EXAM: CHEST - 2 VIEW COMPARISON:  07/09/2018 FINDINGS: The heart size and mediastinal contours are within normal limits. Both lungs are clear. The visualized skeletal structures are  unremarkable. IMPRESSION: No active cardiopulmonary disease. Electronically Signed   By: Luke Bun M.D.   On: 03/17/2024 19:50    EKG: Independently reviewed. Sinus with T wave inversions in III, aVF, V6.  ST depression in II, V5, V6.   Assessment/Plan Principal Problem:   Chest pain    Assessment and Plan:  Chest Pain Negative trop x2.  EKG with T wave inversions in III, aVF, V6.  ST depression in II, V5, V6. Will follow echo LDL 72, A1c 9.5 Tsh wnl Aspirin , statin Appreciate cardiology assistance  Hypertensive Urgency SBP in 200's at presentation With associated chest pain and HA  Sx now resolved BP improved Will continue losartan , prazosin  Consider adding additional meds as needed He'll need refills  Headache Now resolved Suspect due to hypertension above  Concern for Partial or Developing Sigmoid Volvulus No abdominal discomfort on exam, unclear significance Follow CT with contrast   Right Sided Flank Pain  Seems musculoskeletal, trial voltaren   T2DM Needs refills of his meds A1c 9.5  Continue SSI for now  Proteinuria Follow UP/C  Depression  Anxiety Restart prozac  Prazosin , trazodone   He ran out of meds 4-6 months ago.  Not taking any right now.  Will need refills of all meds, will review based on his medical issues, appropriateness.       DVT prophylaxis: lovenox   Code Status:   full  Family Communication:  none  Disposition Plan:   Patient is from:  home  Anticipated DC to:  home  Anticipated DC date:  pending  Anticipated DC barriers: pending  Consults called:  cardiology  Admission status:  obs   Severity of Illness: The appropriate patient status for this patient is OBSERVATION. Observation status is judged to be reasonable and necessary in order to provide the required intensity of service to ensure the patient's safety. The patient's presenting symptoms, physical exam findings, and initial radiographic and laboratory data in the  context of their medical condition is felt to place them at decreased risk for further clinical deterioration. Furthermore, it is anticipated that the patient will be medically stable for discharge from the hospital within 2 midnights of admission.     Meliton Monte MD [facility]  How to contact the [facility] Attending or Consulting provider 7A - 7P or covering provider during after hours 7P -7A, for this patient?   Check the care team in [facility] and look for Yannick Steuber) attending/consulting [facility] provider listed and b) the [facility] team listed Log into [email or web address] and use [facility] universal password to access. If you do not have the password, please contact the hospital operator. Locate the [facility] provider you are looking for under [facility] and page to Zeena Starkel number that you can be directly reached. If you still have difficulty reaching the provider, please page the [facility] (Director on Call) for the Hospitalists listed on amion for assistance.  03/18/2024, 8:52 AM

## 2024-03-18 NOTE — Consult Note (Signed)
 Cardiology Consultation   Patient ID: Dave Moran MRN: [id number]; DOB: [birth year]  Admit date: 03/17/2024 Date of Consult: 03/18/2024  PCP:  Clinic, [facility] Providers Cardiologist:  Vina Gull, MD      Patient Profile: Dave Moran is a 58 y.o. male with a hx of diabetes mellitus, hypertension who is being seen 03/18/2024 for the evaluation of chest pain and hypertensive urgency at the request of A. Meliton Monte, MD.  History of Present Illness: Nuclear study 2019 showed ejection fraction 48% and no ischemia; inferior attenuation versus inferior infarct.  Echocardiogram this admission shows ejection fraction 60 to 65%, severe left ventricular hypertrophy, grade 1 diastolic dysfunction and myocardium with speckled appearance consider cardiac MRI to rule out amyloid.  Has not taken medications in the past 4 to 6 months.  At time of presentation patient's blood pressure was 212/134.  Patient states that yesterday he developed pain in his right hip.  He then developed a headache with dizziness followed by chest heaviness lasting 5 to 10 minutes.  The pain did not radiate, was not pleuritic and was not related to food.  Resolved spontaneously.  He subsequently had nausea.  He had 1 episode of recurrent pain in his substernal area for 5 to 10 minutes similar to previous but has had no pain since.  His headache has resolved after reinitiating his blood pressure medications.  Cardiology now asked to evaluate.   Past Medical History:  Diagnosis Date   Chest pain 07/09/2018   Diabetes mellitus type 2, noninsulin dependent (HCC)    Essential hypertension    Hyperlipidemia    Hypertensive urgency    Hypokalemia    Kidney stone     Past Surgical History:  Procedure Laterality Date   CHOLECYSTECTOMY       Scheduled Meds:  [START ON 03/19/2024] aspirin  EC  81 mg Oral Daily   atorvastatin   40 mg Oral q1800   diclofenac  Sodium  2 g Topical QID   enoxaparin   (LOVENOX ) injection  40 mg Subcutaneous Q24H   FLUoxetine   20 mg Oral Daily   [START ON 03/19/2024] influenza vac split trivalent PF  0.5 mL Intramuscular Tomorrow-1000   insulin  aspart  0-15 Units Subcutaneous TID WC   insulin  aspart  0-5 Units Subcutaneous QHS   losartan   50 mg Oral Daily   [START ON 03/19/2024] pneumococcal 20-valent conjugate vaccine  0.5 mL Intramuscular Tomorrow-1000   [START ON 03/19/2024] prazosin   2 mg Oral QHS   Continuous Infusions:  PRN Meds: acetaminophen  **OR** acetaminophen , labetalol , nitroGLYCERIN , traZODone   Allergies:   No Known Allergies  Social History:   Social History   Socioeconomic History   Marital status: Divorced    Spouse name: Not on file   Number of children: Not on file   Years of education: Not on file   Highest education level: Not on file  Occupational History   Not on file  Tobacco Use   Smoking status: Every Day    Current packs/day: 0.15    Types: Cigarettes   Smokeless tobacco: Never  Vaping Use   Vaping status: Never Used  Substance and Sexual Activity   Alcohol use: Yes   Drug use: Not Currently    Comment: opiates, but is taking Methadone at treatment Center   Sexual activity: Not on file  Other Topics Concern   Not on file  Social History Narrative   Not on file   Social Drivers of Health   Financial  Resource Strain: Not on file  Food Insecurity: No Food Insecurity (03/18/2024)   Hunger Vital Sign    Worried About Running Out of Food in the Last Year: Never true    Ran Out of Food in the Last Year: Never true  Transportation Needs: No Transportation Needs (03/18/2024)   PRAPARE - Administrator, Civil Service (Medical): No    Lack of Transportation (Non-Medical): No  Physical Activity: Not on file  Stress: Not on file  Social Connections: Not on file  Intimate Partner Violence: Not At Risk (03/18/2024)   Humiliation, Afraid, Rape, and Kick questionnaire    Fear of Current or Ex-Partner: No     Emotionally Abused: No    Physically Abused: No    Sexually Abused: No    Family History:    Family History  Problem Relation Age of Onset   Asthma Mother      ROS:  Please see the history of present illness.  No fevers, chills, productive cough or hemoptysis. All other ROS reviewed and negative.     Physical Exam/Data: Vitals:   03/17/24 2201 03/18/24 0253 03/18/24 0559 03/18/24 0839  BP: (!) 203/105 (!) 147/82 106/67 (!) 145/70  Pulse: 90 83 72 70  Resp: (!) 22 17 18 17   Temp: 98.4 F (36.9 C)  98.3 F (36.8 C) 98.1 F (36.7 C)  TempSrc:   Oral   SpO2: 98% 97% 98% 96%  Weight:   80.2 kg   Height:   5' 7 (1.702 m)    No intake or output data in the 24 hours ending 03/18/24 1145    03/18/2024    5:59 AM 03/17/2024    6:41 PM 02/05/2019    1:26 PM  Last 3 Weights  Weight (lbs) 176 lb 12.9 oz 156 lb 1.4 oz 156 lb  Weight (kg) 80.2 kg 70.8 kg 70.761 kg     Body mass index is 27.69 kg/m.  General:  Well nourished, well developed, in no acute distress HEENT: normal Neck: no JVD Vascular: No carotid bruits; Distal pulses 2+ bilaterally Cardiac:  normal S1, S2; RRR; no murmur  Lungs:  clear to auscultation bilaterally, no wheezing, rhonchi or rales  Abd: soft, nontender, no hepatomegaly  Ext: no edema Musculoskeletal:  No deformities, BUE and BLE strength normal and equal Skin: warm and dry  Neuro:  CNs 2-12 intact, no focal abnormalities noted Psych:  Normal affect   EKG:  The EKG was personally reviewed and demonstrates: Sinus rhythm with inferolateral T wave inversion.  Findings are similar to July 09, 2018. Telemetry:  Telemetry was personally reviewed and demonstrates: Sinus rhythm   Laboratory Data: High Sensitivity Troponin:   Recent Labs  Lab 03/17/24 1920 03/17/24 2139  TROPONINIHS 15 16     Chemistry Recent Labs  Lab 03/17/24 1920 03/17/24 1925 03/18/24 0458  NA 133* 134* 131*  K 4.4 4.5 4.0  CL 97* 99 98  CO2 24  --  21*  GLUCOSE 236*  232* 269*  BUN 12 13 14   CREATININE 0.97 1.00 1.11  CALCIUM  9.3  --  8.6*  MG  --   --  1.9  GFRNONAA >60  --  >60  ANIONGAP 12  --  12    Recent Labs  Lab 03/17/24 1920  PROT 7.3  ALBUMIN 4.0  AST 25  ALT 35  ALKPHOS 77  BILITOT 0.6   Lipids  Recent Labs  Lab 03/18/24 0458  CHOL 148  TRIG  81  HDL 60  LDLCALC 72  CHOLHDL 2.5    Hematology Recent Labs  Lab 03/17/24 1920 03/17/24 1925 03/18/24 0458  WBC 7.2  --  6.0  RBC 5.59  --  5.19  HGB 17.0 17.7* 15.7  HCT 49.7 52.0 46.0  MCV 88.9  --  88.6  MCH 30.4  --  30.3  MCHC 34.2  --  34.1  RDW 12.2  --  12.1  PLT 309  --  254   Thyroid   Recent Labs  Lab 03/18/24 0458  TSH 2.916      Radiology/Studies:  ECHOCARDIOGRAM COMPLETE Result Date: 03/18/2024    ECHOCARDIOGRAM REPORT   Patient Name:   Dave Moran Date of Exam: 03/18/2024 Medical Rec #:  [id number]     Height:       67.0 in Accession #:    [id number]    Weight:       176.8 lb Date of Birth:  1966/05/31     BSA:          1.919 m Patient Age:    58 years      BP:           106/67 mmHg Patient Gender: M             HR:           70 bpm. Exam Location:  Inpatient Procedure: 2D Echo, Color Doppler, Limited Color Doppler and Strain Analysis            (Both Spectral and Color Flow Doppler were utilized during            procedure). Indications:    Chest Pain R07.9  History:        Patient has no prior history of Echocardiogram examinations.                 Signs/Symptoms:Chest Pain; Risk Factors:Hypertension and                 Diabetes.  Sonographer:    BERNARDA ROCKS Referring Phys: A CALDWELL POWELL JR IMPRESSIONS  1. Myocardium with speckled appearance; consider cardiac MRI to R/O amyloid.  2. Left ventricular ejection fraction, by estimation, is 60 to 65%. The left ventricle has normal function. The left ventricle has no regional wall motion abnormalities. There is severe left ventricular hypertrophy. Left ventricular diastolic parameters  are consistent with Grade  I diastolic dysfunction (impaired relaxation). The average left ventricular global longitudinal strain is -20.4 %. The global longitudinal strain is normal.  3. Right ventricular systolic function is normal. The right ventricular size is normal.  4. The mitral valve is normal in structure. No evidence of mitral valve regurgitation. No evidence of mitral stenosis.  5. The aortic valve is tricuspid. Aortic valve regurgitation is not visualized. No aortic stenosis is present.  6. The inferior vena cava is normal in size with greater than 50% respiratory variability, suggesting right atrial pressure of 3 mmHg. Comparison(s): No prior Echocardiogram. FINDINGS  Left Ventricle: Left ventricular ejection fraction, by estimation, is 60 to 65%. The left ventricle has normal function. The left ventricle has no regional wall motion abnormalities. The average left ventricular global longitudinal strain is -20.4 %. Strain was performed and the global longitudinal strain is normal. The left ventricular internal cavity size was normal in size. There is severe left ventricular hypertrophy. Left ventricular diastolic parameters are consistent with Grade I diastolic dysfunction (impaired relaxation). Right Ventricle: The right ventricular size is normal. Right  ventricular systolic function is normal. Left Atrium: Left atrial size was normal in size. Right Atrium: Right atrial size was normal in size. Pericardium: There is no evidence of pericardial effusion. Mitral Valve: The mitral valve is normal in structure. No evidence of mitral valve regurgitation. No evidence of mitral valve stenosis. MV peak gradient, 3.0 mmHg. The mean mitral valve gradient is 1.0 mmHg. Tricuspid Valve: The tricuspid valve is normal in structure. Tricuspid valve regurgitation is not demonstrated. No evidence of tricuspid stenosis. Aortic Valve: The aortic valve is tricuspid. Aortic valve regurgitation is not visualized. No aortic stenosis is present. Aortic  valve mean gradient measures 4.0 mmHg. Aortic valve peak gradient measures 7.7 mmHg. Aortic valve area, by VTI measures 3.04 cm. Pulmonic Valve: The pulmonic valve was normal in structure. Pulmonic valve regurgitation is not visualized. No evidence of pulmonic stenosis. Aorta: The aortic root is normal in size and structure. Venous: The inferior vena cava is normal in size with greater than 50% respiratory variability, suggesting right atrial pressure of 3 mmHg. IAS/Shunts: No atrial level shunt detected by color flow Doppler. Additional Comments: Myocardium with speckled appearance; consider cardiac MRI to R/O amyloid.  LEFT VENTRICLE PLAX 2D LVIDd:         4.90 cm      Diastology LVIDs:         3.30 cm      LV e' medial:    5.77 cm/s LV PW:         1.20 cm      LV E/e' medial:  11.3 LV IVS:        1.20 cm      LV e' lateral:   10.00 cm/s LVOT diam:     2.20 cm      LV E/e' lateral: 6.5 LV SV:         80 LV SV Index:   42           2D Longitudinal Strain LVOT Area:     3.80 cm     2D Strain GLS Avg:     -20.4 %  LV Volumes (MOD) LV vol d, MOD A2C: 130.0 ml LV vol d, MOD A4C: 175.0 ml LV vol s, MOD A2C: 39.4 ml LV vol s, MOD A4C: 65.8 ml LV SV MOD A2C:     90.6 ml LV SV MOD A4C:     175.0 ml LV SV MOD BP:      104.1 ml RIGHT VENTRICLE             IVC RV Basal diam:  3.90 cm     IVC diam: 1.60 cm RV S prime:     14.40 cm/s TAPSE (M-mode): 1.8 cm LEFT ATRIUM             Index        RIGHT ATRIUM           Index LA diam:        4.10 cm 2.14 cm/m   RA Area:     14.10 cm LA Vol (A2C):   34.7 ml 18.08 ml/m  RA Volume:   32.20 ml  16.78 ml/m LA Vol (A4C):   53.4 ml 27.83 ml/m LA Biplane Vol: 47.4 ml 24.70 ml/m  AORTIC VALVE                    PULMONIC VALVE AV Area (Vmax):    2.76 cm     PV Vmax:  0.99 m/s AV Area (Vmean):   2.07 cm     PV Peak grad:  3.9 mmHg AV Area (VTI):     3.04 cm AV Vmax:           139.00 cm/s AV Vmean:          91.300 cm/s AV VTI:            0.263 m AV Peak Grad:      7.7 mmHg AV  Mean Grad:      4.0 mmHg LVOT Vmax:         101.00 cm/s LVOT Vmean:        49.800 cm/s LVOT VTI:          0.210 m LVOT/AV VTI ratio: 0.80  AORTA Ao Root diam: 3.20 cm Ao Asc diam:  3.50 cm MITRAL VALVE MV Area (PHT): 2.62 cm    SHUNTS MV Area VTI:   3.24 cm    Systemic VTI:  0.21 m MV Peak grad:  3.0 mmHg    Systemic Diam: 2.20 cm MV Mean grad:  1.0 mmHg MV Vmax:       0.87 m/s MV Vmean:      52.1 cm/s MV Decel Time: 289 msec MV E velocity: 65.00 cm/s MV A velocity: 87.50 cm/s MV E/A ratio:  0.74 Redell Shallow MD Electronically signed by Redell Shallow MD Signature Date/Time: 03/18/2024/10:32:19 AM    Final    DG Hip Unilat W or Wo Pelvis 2-3 Views Right Result Date: 03/18/2024 CLINICAL DATA:  Right hip pain. EXAM: DG HIP (WITH OR WITHOUT PELVIS) 2-3V RIGHT COMPARISON:  CT abdomen pelvis, and reconstructions 11/23/2018 FINDINGS: There is no evidence of hip fracture or dislocation. There is no evidence of arthropathy or other focal bone abnormality. There are bilateral pelvic surgical clips. IMPRESSION: No evidence of fracture or dislocation. Bilateral pelvic surgical clips. Electronically Signed   By: Francis Quam M.D.   On: 03/18/2024 02:38   CT Renal Stone Study Result Date: 03/18/2024 EXAM: CT UROGRAM 03/18/2024 01:58:49 AM TECHNIQUE: CT of the abdomen and pelvis was performed without the administration of intravenous contrast. COMPARISON: None available. CLINICAL HISTORY: Abdominal/flank pain, stone suspected. Rt flank pain FINDINGS: LOWER CHEST: No acute abnormality. LIVER: The liver is unremarkable. GALLBLADDER AND BILE DUCTS: Status post cholecystectomy. SPLEEN: No acute abnormality. PANCREAS: No acute abnormality. ADRENAL GLANDS: No acute abnormality. KIDNEYS, URETERS AND BLADDER: No stones in the kidneys or ureters. No hydronephrosis. No perinephric or periureteral stranding. Urinary bladder is unremarkable. GI AND BOWEL: No bowel wall thickening. Normal appendix. Stomach is within normal limits.  Redundant sigmoid colon with reversed configuration compared to CT Nov 23, 2018. There is narrowing of the distal descending colon with associated vascular swirling (series 7, image [id number]). Findings may be due to partial or developing volvulus. Thickening or pneumatosis to suggest ischemia. Surgical consult recommended. PERITONEUM AND RETROPERITONEUM: No ascites. No free air. VASCULATURE: Aorta is normal in caliber. LYMPH NODES: No lymphadenopathy. REPRODUCTIVE ORGANS: No acute abnormality. BONES AND SOFT TISSUES: No acute osseous abnormality. IMPRESSION: 1. Narrowing of the distal descending colon with associated vascular swirling, possibly due to partial or developing sigmoid volvulus. No bowel wall thickening or pneumatosis to suggest ischemia. Surgical consult recommended. Electronically signed by: Norman Gatlin MD 03/18/2024 02:37 AM EDT RP Workstation: [id number]   DG Chest 2 View Result Date: 03/17/2024 CLINICAL DATA:  Chest pain EXAM: CHEST - 2 VIEW COMPARISON:  07/09/2018 FINDINGS: The heart size and mediastinal contours are within normal  limits. Both lungs are clear. The visualized skeletal structures are unremarkable. IMPRESSION: No active cardiopulmonary disease. Electronically Signed   By: Luke Bun M.D.   On: 03/17/2024 19:50     Assessment and Plan: Chest pain-symptoms are atypical.  Electrocardiogram shows no new ST changes.  Troponins are normal.  However multiple risk factors.  Will arrange outpatient coronary CTA to rule out obstructive coronary disease. Hypertensive urgency-due to noncompliance with medications.  Losartan  and prazosin  have been reinitiated.  Patient's blood pressure has improved.  Would continue and follow as an outpatient. Hyperlipidemia-resume Lipitor.  He will need lipids and liver in 8 weeks. Left-ventricular hypertrophy-LVH with possible speckled appearance noted on echocardiogram.  However he has severe uncontrolled hypertension due to noncompliance.  This  likely explains left ventricular hypertrophy.  I do not think he requires cardiac MRI at this time. Question volvulus-per primary service. Noncompliance-patient counseled on the importance of medication compliance/blood pressure control. Tobacco abuse-patient counseled on discontinuing.  Cardiology will sign off.  We will arrange coronary CTA as an outpatient.  I will arrange follow-up with APP 2 to 4 weeks following CTA.  Please call with questions.  Patient can be discharged from a cardiac standpoint.   For questions or updates, please contact [facility] Please consult [email or web address] for contact info under    Signed, Redell Shallow, MD  03/18/2024 11:45 AM

## 2024-03-18 NOTE — ED Provider Notes (Signed)
 [facility] Provider Note   CSN: [id number] Arrival date & time: 03/17/24  8167     Patient presents with: Flank Pain, Chest Pain, and Dizziness   Dave Moran is a 58 y.o. male.   The history is provided by the patient.  Flank Pain Associated symptoms include chest pain.  Chest Pain Associated symptoms: dizziness   Dizziness Associated symptoms: chest pain    He has history of hypertension, diabetes, hyperlipidemia and comes in with complaint of pain in the right hip and posterior pelvis extending up to the right flank.  This has been present for about 3 weeks.  He does endorse pain with walking-especially with climbing steps.  Today, he states that he felt his blood pressure might have been high because he had a headache.  He did not check his blood pressure.  He states he has been out of his blood pressure and diabetes medicines for about 4 months.  He has also been having some tightness in his chest since earlier this afternoon.  There is associated dyspnea and nausea but no diaphoresis.  Tightness has been waxing and waning, does not appear to be exertional.  He is a cigarette smoker, denies family history of premature coronary atherosclerosis.    Prior to Admission medications   Medication Sig Start Date End Date Taking? Authorizing Provider  acetaminophen  (TYLENOL ) 325 MG tablet Take 650 mg by mouth every 6 (six) hours as needed for moderate pain.    [provider]  atorvastatin  (LIPITOR) 40 MG tablet Take 1 tablet (40 mg total) by mouth daily at 6 PM. 07/11/18   Jadine Toribio SQUIBB, MD  blood glucose meter kit and supplies KIT Dispense glucometer based on patient and insurance preference. Use up to four times daily as directed. (FOR ICD-9 250.00, 250.01). 07/10/18   Jadine Toribio SQUIBB, MD  dicyclomine  (BENTYL ) 20 MG tablet Take 1 tablet (20 mg total) by mouth every 6 (six) hours as needed for spasms (abdominal cramping).  11/29/18   Malinda Rogue, MD  FLUoxetine  (PROZAC ) 20 MG capsule Take 1 capsule (20 mg total) by mouth daily. 11/30/18   Malinda Rogue, MD  gabapentin  (NEURONTIN ) 300 MG capsule Take 1 capsule (300 mg total) by mouth 3 (three) times daily. 11/29/18   Malinda Rogue, MD  glipiZIDE  (GLUCOTROL ) 5 MG tablet Take 1 tablet (5 mg total) by mouth 2 (two) times daily before a meal. 07/10/18 02/05/19  Jadine Toribio SQUIBB, MD  hydrOXYzine  (ATARAX /VISTARIL ) 25 MG tablet Take 1 tablet (25 mg total) by mouth every 6 (six) hours as needed for anxiety. 11/29/18   Wonda Clarita BRAVO, NP  losartan  (COZAAR ) 50 MG tablet Take 1 tablet (50 mg total) by mouth daily. 07/11/18   Jadine Toribio SQUIBB, MD  metFORMIN  (GLUCOPHAGE ) 500 MG tablet Take 1 tablet (500 mg total) by mouth 2 (two) times daily with a meal. 07/10/18   Jadine Toribio SQUIBB, MD  pantoprazole  (PROTONIX ) 40 MG tablet Take 1 tablet (40 mg total) by mouth 2 (two) times daily. 11/29/18   Malinda Rogue, MD  polyethylene glycol (MIRALAX  / GLYCOLAX ) 17 g packet Take 17 g by mouth 2 (two) times daily. 02/05/19   Dean Clarity, MD  prazosin  (MINIPRESS ) 2 MG capsule Take 2 capsules (4 mg total) by mouth at bedtime. 11/29/18   Malinda Rogue, MD  propranolol  (INDERAL ) 40 MG tablet Take 1 tablet (40 mg total) by mouth 3 (three) times daily. 11/29/18   Malinda Rogue, MD  traZODone  (DESYREL ) 100 MG tablet Take 1 tablet (100 mg total) by mouth at bedtime as needed for sleep. 11/29/18   Wonda Clarita BRAVO, NP    Allergies: Patient has no known allergies.    Review of Systems  Cardiovascular:  Positive for chest pain.  Genitourinary:  Positive for flank pain.  Neurological:  Positive for dizziness.  All other systems reviewed and are negative.   Updated Vital Signs BP (!) 203/105 (BP Location: Left Arm)   Pulse 90   Temp 98.4 F (36.9 C)   Resp (!) 22   Ht 5' 6 (1.676 m)   Wt 70.8 kg   SpO2 98%   BMI 25.19 kg/m   Physical Exam Vitals and nursing note reviewed.   58 year old male,  resting comfortably and in no acute distress. Vital signs are significant for markedly elevated blood pressure. Oxygen saturation is 98%, which is normal. Head is normocephalic and atraumatic. PERRLA, EOMI. Oropharynx is clear. Neck is nontender and supple without adenopathy or JVD. Back is tender in the right paralumbar area and right sacral area.  Straight leg raise is positive on the right at 30 degrees. Lungs are clear without rales, wheezes, or rhonchi. Chest is nontender. Heart has regular rate and rhythm without murmur. Abdomen is soft, flat, nontender. Extremities: There is tenderness to palpation in the lateral and posterior aspects of the right hip, pain with passive range of motion of the right hip.  Full range of motion present all other joints without pain, no cyanosis or edema. Skin is warm and dry without rash. Neurologic: Mental status is normal, cranial nerves are intact, moves all extremities equally.  (all labs ordered are listed, but only abnormal results are displayed) Labs Reviewed  COMPREHENSIVE METABOLIC PANEL WITH GFR - Abnormal; Notable for the following components:      Result Value   Sodium 133 (*)    Chloride 97 (*)    Glucose, Bld 236 (*)    All other components within normal limits  URINALYSIS, ROUTINE W REFLEX MICROSCOPIC - Abnormal; Notable for the following components:   APPearance HAZY (*)    Glucose, UA >=500 (*)    Hgb urine dipstick SMALL (*)    Ketones, ur 20 (*)    Protein, ur 100 (*)    All other components within normal limits  CBG MONITORING, ED - Abnormal; Notable for the following components:   Glucose-Capillary 235 (*)    All other components within normal limits  I-STAT CHEM 8, ED - Abnormal; Notable for the following components:   Sodium 134 (*)    Glucose, Bld 232 (*)    Hemoglobin 17.7 (*)    All other components within normal limits  LIPASE, BLOOD  CBC  TROPONIN I (HIGH SENSITIVITY)  TROPONIN I (HIGH SENSITIVITY)    EKG: EKG  Interpretation Date/Time:  Friday March 17 2024 18:41:57 EDT Ventricular Rate:  95 PR Interval:  148 QRS Duration:  98 QT Interval:  348 QTC Calculation: 437 R Axis:   61  Text Interpretation: Normal sinus rhythm ST & T wave abnormality, consider inferior ischemia Abnormal ECG When compared with ECG of 10-Jul-2018 06:04, ST-t abnormality is now present Reconfirmed by Raford Lenis ([id number]) on 03/18/2024 1:29:39 AM  Radiology: [facility] Chest 2 View Result Date: 03/17/2024 CLINICAL DATA:  Chest pain EXAM: CHEST - 2 VIEW COMPARISON:  07/09/2018 FINDINGS: The heart size and mediastinal contours are within normal limits. Both lungs are clear. The visualized skeletal structures are unremarkable.  IMPRESSION: No active cardiopulmonary disease. Electronically Signed   By: Luke Bun M.D.   On: 03/17/2024 19:50     Procedures   Medications Ordered in the ED  aspirin  chewable tablet 324 mg (has no administration in time range)  metFORMIN  (GLUCOPHAGE ) tablet 500 mg (has no administration in time range)  losartan  (COZAAR ) tablet 50 mg (has no administration in time range)  prazosin  (MINIPRESS ) capsule 2 mg (has no administration in time range)  acetaminophen  (TYLENOL ) tablet 650 mg (has no administration in time range)                HEART Score: 5                    Medical Decision Making Amount and/or Complexity of Data Reviewed Radiology: ordered.  Risk OTC drugs. Prescription drug management. Decision regarding hospitalization.   Multiple complaints.  Chest discomfort is concerning for angina pectoris and patient with significant risk factors.  Right hip pain could either be right-sided sciatica or arthritis of the right hip.  Elevated blood pressure secondary to medication noncompliance.  This presentation has a wide range of treatment options and carries with it a high risk of morbidity and complications.  In addition to the above, differential diagnoses also includes GERD,  urolithiasis, pyelonephritis.  I have reviewed his electrocardiogram, my interpretation is ST and T changes concerning for inferior wall ischemia which are new compared with 06/30/2018.  Chest x-ray shows no active cardiopulmonary process.  Have independently viewed the images, and agree with the radiologist's interpretation.  I have reviewed his laboratory tests, my interpretation is glycosuria, proteinuria, normal troponin x 2, normal lipase, mildly elevated glucose, mild hyponatremia which is not felt to be clinically significant, normal CBC.  Heart score is 5 which puts him at moderate risk for major adverse cardiac events in the next 6 weeks.  However, the combination of markedly elevated blood pressure, headache, chest discomfort is concerning for hypertensive urgency.  I feel he should be admitted for reinstitution of antihypertensive medications and consideration for cardiac evaluation.  I have ordered x-rays of his right hip to look for evidence of arthritis, and a renal stone protocol CT scan.  CT scan shows narrowing of the distal descending colon with associated vascular swirling concerning for developing sigmoid volvulus.  However, he has absolutely no abdominal complaints and his abdominal exam is completely benign.  Clinically, no suspicion for volvulus.  Therefore, I am not ordering a surgery consult, but will get consult if he starts to develop abdominal pain.  I have independently viewed the images, and agree with the radiologist's interpretation.  Blood pressure has come down with reinstituting his usual antihypertensive medication.  In light of medication noncompliance, markedly elevated initial blood pressure I feel that his chest pain should be evaluated as an inpatient, at least initially.  I have discussed the case with Dr. Keturah of [facility], who agrees to admit the patient.  CRITICAL CARE Performed by: Alm Lias Total critical care time: 40 minutes Critical care time was  exclusive of separately billable procedures and treating other patients. Critical care was necessary to treat or prevent imminent or life-threatening deterioration. Critical care was time spent personally by me on the following activities: development of treatment plan with patient and/or surrogate as well as nursing, discussions with consultants, evaluation of patient's response to treatment, examination of patient, obtaining history from patient or surrogate, ordering and performing treatments and interventions, ordering and review of laboratory  studies, ordering and review of radiographic studies, pulse oximetry and re-evaluation of patient's condition.     Final diagnoses:  Nonspecific chest pain  Hyponatremia  Poorly-controlled hypertension    ED Discharge Orders     None          Raford Lenis, MD 03/18/24 [phone]

## 2024-03-18 NOTE — Progress Notes (Signed)
 Staff message sent to arrange outpatient coronary CTA and follow up afterward.

## 2024-03-18 NOTE — Progress Notes (Signed)
 Patient stated he had taken a shower around 17:30.

## 2024-03-19 ENCOUNTER — Other Ambulatory Visit (HOSPITAL_COMMUNITY): Payer: Self-pay

## 2024-03-19 LAB — GLUCOSE, CAPILLARY: Glucose-Capillary: 339 mg/dL — ABNORMAL HIGH (ref 70–99)

## 2024-03-19 MED ORDER — DAPAGLIFLOZIN PROPANEDIOL 10 MG PO TABS: 10.0000 mg | ORAL_TABLET | Freq: Every day | ORAL | 3 refills | Status: DC

## 2024-03-19 MED ORDER — DAPAGLIFLOZIN PROPANEDIOL 10 MG PO TABS
10.0000 mg | ORAL_TABLET | Freq: Every day | ORAL | 0 refills | Status: DC
Start: 2024-03-19 — End: 2024-03-19

## 2024-03-19 MED ORDER — FLUOXETINE HCL 20 MG PO CAPS
20.0000 mg | ORAL_CAPSULE | Freq: Every day | ORAL | 0 refills | Status: AC
  Filled 2024-03-19: qty 30, 30d supply, fill #0

## 2024-03-19 MED ORDER — DAPAGLIFLOZIN PROPANEDIOL 10 MG PO TABS: 10.0000 mg | ORAL_TABLET | Freq: Every day | ORAL | 0 refills | Status: AC

## 2024-03-19 NOTE — Progress Notes (Signed)
 DISCHARGE NOTE HOME Dave Moran to be discharged Home per MD order. Discussed prescriptions and follow up appointments with the patient. Prescriptions given to patient; medication list explained in detail. Patient verbalized understanding.  Skin clean, dry and intact without evidence of skin break down, no evidence of skin tears noted. IV catheter discontinued intact. Site without signs and symptoms of complications. Dressing and pressure applied. Pt denies pain at the site currently. No complaints noted.  Patient free of lines, drains, and wounds.   An After Visit Summary (AVS) was printed and given to the patient. Taken to the Discharge Loung to wait for TOC meds that were added. Patient will be escorted via wheelchair, and discharged home via private auto.  Peyton SHAUNNA Pepper, RN

## 2024-03-19 NOTE — Progress Notes (Signed)
 Discharged 9/6, stayed b/c he hadn't gotten meds from [facility] (admission was for HTN urgency/CP in setting of severely elevated BP's).  He's stable today.  No complaints.    Vitals:   03/19/24 0416 03/19/24 0814  BP: (!) 164/84 (!) 178/97  Pulse: 67 72  Resp: 20 18  Temp: (!) 97.3 F (36.3 C) 98.1 F (36.7 C)  SpO2: 97% 98%   Good for discharge, see discharge summary from 9/6.  Emphasized importance of follow up.

## 2024-03-19 NOTE — Plan of Care (Signed)

## 2024-03-19 NOTE — Progress Notes (Signed)
 1120  per DC Lounge NT giving Patient remaining TOC meds, work note and script to take to the [facility] to be filled  Patient calling ride now

## 2024-03-20 ENCOUNTER — Telehealth: Payer: Self-pay | Admitting: *Deleted

## 2024-03-20 ENCOUNTER — Other Ambulatory Visit: Payer: Self-pay | Admitting: *Deleted

## 2024-03-20 ENCOUNTER — Encounter: Payer: Self-pay | Admitting: *Deleted

## 2024-03-20 MED ORDER — METOPROLOL TARTRATE 100 MG PO TABS: ORAL_TABLET | ORAL | 0 refills | Status: DC

## 2024-03-20 NOTE — Telephone Encounter (Signed)
 Coronary CTA instructions sent to patient via my chart

## 2024-03-20 NOTE — Telephone Encounter (Signed)
-----   Message from [facility] sent at 03/18/2024 11:55 AM EDT ----- Regarding: outpatient Coronary CT and follow up Patient of Dr. Vina Gull, seen by Dr. Pietro during this hosptalization who recommended the following:  1. Please arrange a outpatient coronary CTA No metformin  for 24 hours before and 48 hours after coronary CT. Hold losartan  in the morning of coronary CT and prescribe a single dose of 100 mg metoprolol  tartrate to take 2 hours prior to coronary CT.   2. Scheduler please arrange a follow up with APP about 2 weeks after the coronary CTA.  Thanks  Hao

## 2024-03-22 ENCOUNTER — Encounter (HOSPITAL_COMMUNITY): Payer: Self-pay

## 2024-03-24 ENCOUNTER — Ambulatory Visit (HOSPITAL_COMMUNITY)
Admission: RE | Admit: 2024-03-24 | Discharge: 2024-03-24 | Disposition: A | Discharge status: Home / Self Care | Source: Ambulatory Visit | Attending: Physician Assistant | Admitting: Physician Assistant

## 2024-03-24 DIAGNOSIS — R072 Precordial pain: Secondary | ICD-10-CM | POA: Insufficient documentation

## 2024-03-24 MED ORDER — IOHEXOL 350 MG/ML SOLN
100.0000 mL | Freq: Once | INTRAVENOUS | Status: AC | PRN
  Administered 2024-03-24: 100 mL via INTRAVENOUS

## 2024-03-24 MED ORDER — NITROGLYCERIN 0.4 MG SL SUBL
0.8000 mg | SUBLINGUAL_TABLET | Freq: Once | SUBLINGUAL | Status: AC
  Administered 2024-03-24: 0.8 mg via SUBLINGUAL

## 2024-03-27 ENCOUNTER — Ambulatory Visit: Payer: Self-pay | Admitting: Physician Assistant

## 2024-03-29 NOTE — Progress Notes (Signed)
 Extra-cardiac portion read by radiologist. Diffuse bronchial wall thickening suggestive of nonspecific bronchitis. If chest discomfort or shortness of breath worsens, may need to see PCP to see if antibiotic is warranted, but typically bronchitis will self resolve.

## 2024-04-06 NOTE — Progress Notes (Deleted)
 Cardiology Office Note    Patient Name: Dave Moran Date of Encounter: 04/06/2024  Primary Care Provider:  [facility] Primary Cardiologist:  Vina Gull, MD Primary Electrophysiologist: None   Past Medical History    Past Medical History:  Diagnosis Date   Chest pain 07/09/2018   Diabetes mellitus type 2, noninsulin dependent (HCC)    Essential hypertension    Hyperlipidemia    Hypertensive urgency    Hypokalemia    Kidney stone     History of Present Illness  Dave Moran is a 58 y.o. male with a PMH of coronary artery disease, DM, HTN, aortic atherosclerosis, tobacco abuse, who presents today for post hospital follow-up.  Dave Moran presented to the ED on 03/18/2024 with complaint of chest pain, dizziness and flank pain.  He described waxing and waning tightness in his chest that was nonexertional.  He underwent a previous ischemic evaluation in 2019 by nuclear study that showed no ischemia. 2D echo was completed showing EF of 60 to 65% with severe LVH and grade 1 DD with speckled appearance of myocardium and recommendation of cardiac MRI to rule out amyloid.  EKG was negative for ACS and troponins were normal.  He was treated with losartan  and prazosin  for elevated BP with improvement.  He was discharged in stable condition and completed a coronary CTA for further evaluation that showed minimal CAD with aortic atherosclerosis and calcium  score 12.7.  He is currently on ASA 81 mg and Lipitor 20 mg.   Patient denies chest pain, palpitations, dyspnea, PND, orthopnea, nausea, vomiting, dizziness, syncope, edema, weight gain, or early satiety.   Discussed the use of AI scribe software for clinical note transcription with the patient, who gave verbal consent to proceed.  History of Present Illness    ***Notes:   Review of Systems  Please see the history of present illness.    All other systems reviewed and are otherwise negative except as noted  above.  Physical Exam    Wt Readings from Last 3 Encounters:  03/18/24 176 lb 12.9 oz (80.2 kg)  02/05/19 156 lb (70.8 kg)  10/17/18 170 lb (77.1 kg)   CD:Uyzmz were no vitals filed for this visit.,There is no height or weight on file to calculate BMI. GEN: Well nourished, well developed in no acute distress Neck: No JVD; No carotid bruits Pulmonary: Clear to auscultation without rales, wheezing or rhonchi  Cardiovascular: Normal rate. Regular rhythm. Normal S1. Normal S2.   Murmurs: There is no murmur.  ABDOMEN: Soft, non-tender, non-distended EXTREMITIES:  No edema; No deformity   EKG/LABS/ Recent Cardiac Studies   ECG personally reviewed by me today - ***  Risk Assessment/Calculations:   {Does this patient have ATRIAL FIBRILLATION?[phone]}      Lab Results  Component Value Date   WBC 6.4 03/18/2024   HGB 16.1 03/18/2024   HCT 46.1 03/18/2024   MCV 87.5 03/18/2024   PLT 296 03/18/2024   Lab Results  Component Value Date   CREATININE 1.06 03/18/2024   BUN 14 03/18/2024   NA 131 (L) 03/18/2024   K 4.0 03/18/2024   CL 98 03/18/2024   CO2 21 (L) 03/18/2024   Lab Results  Component Value Date   CHOL 148 03/18/2024   HDL 60 03/18/2024   LDLCALC 72 03/18/2024   TRIG 81 03/18/2024   CHOLHDL 2.5 03/18/2024    Lab Results  Component Value Date   HGBA1C 9.5 (H) 03/18/2024   Assessment & Plan  Assessment and Plan Assessment & Plan     1.  Nonobstructive CAD  2.  Primary HTN  3.  Hyperlipidemia  4.  DM type II  5.      Disposition: Follow-up with Vina Gull, MD or APP in *** months {Are you ordering a CV Procedure (e.g. stress test, cath, DCCV, TEE, etc)?   Press F2        :[id number]}   Signed, Wyn Raddle, Jackee Shove, NP 04/06/2024, 6:05 PM [facility]

## 2024-04-07 ENCOUNTER — Ambulatory Visit: Admitting: Nurse Practitioner

## 2024-04-07 DIAGNOSIS — E785 Hyperlipidemia, unspecified: Secondary | ICD-10-CM

## 2024-04-07 DIAGNOSIS — I10 Essential (primary) hypertension: Secondary | ICD-10-CM

## 2024-04-07 DIAGNOSIS — E119 Type 2 diabetes mellitus without complications: Secondary | ICD-10-CM

## 2024-04-07 DIAGNOSIS — I2510 Atherosclerotic heart disease of native coronary artery without angina pectoris: Secondary | ICD-10-CM

## 2024-04-25 NOTE — Progress Notes (Deleted)
 Cardiology Office Note   Date:  04/25/2024  ID:  Dave Moran, DOB 1966/06/22, MRN [id number] PCP: [facility], [facility] Providers Cardiologist:  Vina Gull, MD { Click to update primary MD,subspecialty MD or APP then REFRESH:1}    History of Present Illness Dave Moran is a 58 y.o. male with a past medical history of hypertension, DM 2  03/28/2024 coronary CT calcium  score of 12.7, 67th percentile, minimal calcified plaque in the LAD 03/18/2024 echo myocardium with speckled appearance, EF 60 to 65%, severe LVH, grade 1 DD 07/10/2018 echo EF 55 to 60%, moderate concentric LVH, LVPW 15.6 mm 07/10/2018 MPI no evidence of myocardial ischemia, mild global hypokinesis  Mr. Kroening presented to the emergency department on 03/17/2024 for chest pain, troponins were negative x 2, underwent echocardiogram revealing speckled appearance of his myocardium with severe LVH, his blood pressure was also very elevated at 212/134, he had not been taking any medications for the past 4 to 6 months.  A coronary CT was arranged outpatient revealing a calcium  score of 12.7, 67th percentile with minimal calcified plaque in the LAD.  consider cardiac  MRI to R/O amyloid.  We will refer patient to Dr. Arnetha for further evaluation ROS: ROS   Studies Reviewed      Cardiac Studies & Procedures   ______________________________________________________________________________________________   STRESS TESTS  NM MYOCAR MULTI W/SPECT W 07/10/2018  Narrative CLINICAL DATA:  58 year old presenting with acute chest pain. Current smoker. Current history of diabetes and hypertension.  EXAM: MYOCARDIAL IMAGING WITH SPECT (REST AND PHARMACOLOGIC-STRESS)  GATED LEFT VENTRICULAR WALL MOTION STUDY  LEFT VENTRICULAR EJECTION FRACTION  TECHNIQUE: Standard myocardial SPECT imaging was performed after resting intravenous injection of 10 mCi Tc-99m tetrofosmin . Subsequently, intravenous  infusion of Lexiscan  was performed under the supervision of the Cardiology staff. At peak effect of the drug, 30 mCi Tc-33m tetrofosmin  was injected intravenously and standard myocardial SPECT imaging was performed. Quantitative gated imaging was also performed to evaluate left ventricular wall motion, and estimate left ventricular ejection fraction.  COMPARISON:  None.  FINDINGS: Perfusion: Mild diminished activity involving a short segment of the distal INFERIOR wall on resting and post Lexiscan  images. No evidence of reversibility.  Wall Motion: Mild global hypokinesis without focal wall motion abnormality. Mild LEFT ventricular enlargement.  Left Ventricular Ejection Fraction: 48%  End diastolic volume 139 ml  End systolic volume 72 ml  IMPRESSION: 1. No evidence of myocardial ischemia. Attenuation versus myocardial infarct involving a short segment of the distal INFERIOR wall.  2. Mild global hypokinesis.  No focal wall motion abnormality.  3. Left ventricular ejection fraction 48%  4.  Mild left ventricular enlargement.  5. Non invasive risk stratification*: Low.  *2012 Appropriate Use Criteria for Coronary Revascularization Focused Update: J Am Coll Cardiol. 2012;59(9):857-881. http://content.dementiazones.com.aspx?articleid=1201161.   Electronically Signed By: Debby Satterfield M.D. On: 07/10/2018 14:25   ECHOCARDIOGRAM  ECHOCARDIOGRAM COMPLETE 03/18/2024  Narrative ECHOCARDIOGRAM REPORT    Patient Name:   Dave Moran Date of Exam: 03/18/2024 Medical Rec #:  [id number]     Height:       67.0 in Accession #:    [id number]    Weight:       176.8 lb Date of Birth:  1966-03-30     BSA:          1.919 m Patient Age:    58 years      BP:           106/67 mmHg Patient  Gender: M             HR:           70 bpm. Exam Location:  Inpatient  Procedure: 2D Echo, Color Doppler, Limited Color Doppler and Strain Analysis (Both Spectral and Color Flow Doppler  were utilized during procedure).  Indications:    Chest Pain R07.9  History:        Patient has no prior history of Echocardiogram examinations. Signs/Symptoms:Chest Pain; Risk Factors:Hypertension and Diabetes.  Sonographer:    BERNARDA ROCKS Referring Phys: A CALDWELL POWELL JR  IMPRESSIONS   1. Myocardium with speckled appearance; consider cardiac MRI to R/O amyloid. 2. Left ventricular ejection fraction, by estimation, is 60 to 65%. The left ventricle has normal function. The left ventricle has no regional wall motion abnormalities. There is severe left ventricular hypertrophy. Left ventricular diastolic parameters are consistent with Grade I diastolic dysfunction (impaired relaxation). The average left ventricular global longitudinal strain is -20.4 %. The global longitudinal strain is normal. 3. Right ventricular systolic function is normal. The right ventricular size is normal. 4. The mitral valve is normal in structure. No evidence of mitral valve regurgitation. No evidence of mitral stenosis. 5. The aortic valve is tricuspid. Aortic valve regurgitation is not visualized. No aortic stenosis is present. 6. The inferior vena cava is normal in size with greater than 50% respiratory variability, suggesting right atrial pressure of 3 mmHg.  Comparison(s): No prior Echocardiogram.  FINDINGS Left Ventricle: Left ventricular ejection fraction, by estimation, is 60 to 65%. The left ventricle has normal function. The left ventricle has no regional wall motion abnormalities. The average left ventricular global longitudinal strain is -20.4 %. Strain was performed and the global longitudinal strain is normal. The left ventricular internal cavity size was normal in size. There is severe left ventricular hypertrophy. Left ventricular diastolic parameters are consistent with Grade I diastolic dysfunction (impaired relaxation).  Right Ventricle: The right ventricular size is normal. Right  ventricular systolic function is normal.  Left Atrium: Left atrial size was normal in size.  Right Atrium: Right atrial size was normal in size.  Pericardium: There is no evidence of pericardial effusion.  Mitral Valve: The mitral valve is normal in structure. No evidence of mitral valve regurgitation. No evidence of mitral valve stenosis. MV peak gradient, 3.0 mmHg. The mean mitral valve gradient is 1.0 mmHg.  Tricuspid Valve: The tricuspid valve is normal in structure. Tricuspid valve regurgitation is not demonstrated. No evidence of tricuspid stenosis.  Aortic Valve: The aortic valve is tricuspid. Aortic valve regurgitation is not visualized. No aortic stenosis is present. Aortic valve mean gradient measures 4.0 mmHg. Aortic valve peak gradient measures 7.7 mmHg. Aortic valve area, by VTI measures 3.04 cm.  Pulmonic Valve: The pulmonic valve was normal in structure. Pulmonic valve regurgitation is not visualized. No evidence of pulmonic stenosis.  Aorta: The aortic root is normal in size and structure.  Venous: The inferior vena cava is normal in size with greater than 50% respiratory variability, suggesting right atrial pressure of 3 mmHg.  IAS/Shunts: No atrial level shunt detected by color flow Doppler.  Additional Comments: Myocardium with speckled appearance; consider cardiac MRI to R/O amyloid.   LEFT VENTRICLE PLAX 2D LVIDd:         4.90 cm      Diastology LVIDs:         3.30 cm      LV e' medial:    5.77 cm/s LV PW:  1.20 cm      LV E/e' medial:  11.3 LV IVS:        1.20 cm      LV e' lateral:   10.00 cm/s LVOT diam:     2.20 cm      LV E/e' lateral: 6.5 LV SV:         80 LV SV Index:   42           2D Longitudinal Strain LVOT Area:     3.80 cm     2D Strain GLS Avg:     -20.4 %  LV Volumes (MOD) LV vol d, MOD A2C: 130.0 ml LV vol d, MOD A4C: 175.0 ml LV vol s, MOD A2C: 39.4 ml LV vol s, MOD A4C: 65.8 ml LV SV MOD A2C:     90.6 ml LV SV MOD A4C:      175.0 ml LV SV MOD BP:      104.1 ml  RIGHT VENTRICLE             IVC RV Basal diam:  3.90 cm     IVC diam: 1.60 cm RV S prime:     14.40 cm/s TAPSE (M-mode): 1.8 cm  LEFT ATRIUM             Index        RIGHT ATRIUM           Index LA diam:        4.10 cm 2.14 cm/m   RA Area:     14.10 cm LA Vol (A2C):   34.7 ml 18.08 ml/m  RA Volume:   32.20 ml  16.78 ml/m LA Vol (A4C):   53.4 ml 27.83 ml/m LA Biplane Vol: 47.4 ml 24.70 ml/m AORTIC VALVE                    PULMONIC VALVE AV Area (Vmax):    2.76 cm     PV Vmax:       0.99 m/s AV Area (Vmean):   2.07 cm     PV Peak grad:  3.9 mmHg AV Area (VTI):     3.04 cm AV Vmax:           139.00 cm/s AV Vmean:          91.300 cm/s AV VTI:            0.263 m AV Peak Grad:      7.7 mmHg AV Mean Grad:      4.0 mmHg LVOT Vmax:         101.00 cm/s LVOT Vmean:        49.800 cm/s LVOT VTI:          0.210 m LVOT/AV VTI ratio: 0.80  AORTA Ao Root diam: 3.20 cm Ao Asc diam:  3.50 cm  MITRAL VALVE MV Area (PHT): 2.62 cm    SHUNTS MV Area VTI:   3.24 cm    Systemic VTI:  0.21 m MV Peak grad:  3.0 mmHg    Systemic Diam: 2.20 cm MV Mean grad:  1.0 mmHg MV Vmax:       0.87 m/s MV Vmean:      52.1 cm/s MV Decel Time: 289 msec MV E velocity: 65.00 cm/s MV A velocity: 87.50 cm/s MV E/A ratio:  0.74  Redell Shallow MD Electronically signed by Redell Shallow MD Signature Date/Time: 03/18/2024/10:32:19 AM    Final      CT  SCANS  CT CORONARY MORPH W/CTA COR W/SCORE 03/24/2024  Addendum 03/28/2024  8:01 AM ADDENDUM REPORT: 03/28/2024 07:59  ADDENDUM: Limited non-coronary overread as follows:  Cardiovascular: No significant extracardiac vascular findings. Normal heart size. No pericardial effusion.  Limited Mediastinum/Nodes: No enlarged mediastinal, hilar, or axillary lymph nodes. Trachea and esophagus demonstrate no significant findings.  Limited Lungs/Pleura: Diffuse bilateral bronchial wall thickening. No pleural effusion  or pneumothorax.  Upper Abdomen: No acute abnormality.  Musculoskeletal: No chest wall abnormality. No acute osseous findings.  IMPRESSION: Nonspecific infectious or inflammatory bronchitis.   Electronically Signed By: Marolyn JONETTA Jaksch M.D. On: 03/28/2024 07:59  Narrative CLINICAL DATA:  45M with diabetes, hypertension and chest pain.  EXAM: Cardiac/Coronary  CT  TECHNIQUE: The patient was scanned on a GE Apex scanner.  PROTOCOL: A non-contrast, gated CT scan was obtained with axial slices of 2.5 mm through the heart for calcium  scoring. Calcium  scoring was performed using the Agatston method. A 120 kV prospective, gated, contrast cardiac CT scan was obtained. Gantry rotation speed was 230 msec and collimation was 0.63 mm. Two sublingual nitroglycerin  tablets (0.8 mg) were given. The 3D data set was reconstructed with motion correction for the best systolic or diastolic phase. Images were analyzed on a dedicated workstation using MPR, MIP, and VRT modes. The patient received 95 cc of contrast.  FINDINGS: Aorta: Normal size.  Aortic atherosclerosis.  No dissection.  Aortic Valve:  Trileaflet.  No calcifications.  Coronary Arteries:  Normal coronary origin.  Right dominance.  RCA is a large dominant artery that gives rise to PDA and PLVB. There is no plaque.  Left main is a large artery that gives rise to LAD and LCX arteries.  LAD is a large vessel that has minimal (<25%) calcified plaque. D1 and D2 have no plaque.  LCX is a non-dominant artery that gives rise to one large OM1 branch. There is no plaque.  Coronary Calcium  Score:  Left main: 0  Left anterior descending artery: 12.7  Left circumflex artery: 0  Right coronary artery: 0  Total: 12.7  Percentile: 65th  Other findings:  Normal pulmonary vein drainage into the left atrium.  Normal let atrial appendage without a thrombus.  Normal size of the pulmonary artery.  Non-cardiac: See separate  report from [facility].  IMPRESSION: 1. Coronary calcium  score of 12.7. This was 67th percentile for age-, sex, and race-matched controls.  2. Normal coronary origin with right dominance.  3. There is minimal (<25%) calcified plaque in the LAD. CAD-RADS 1.  4.  Aortic atherosclerosis.  RECOMMENDATIONS: CAD-RADS 1: Minimal non-obstructive CAD (0-24%). Consider non-atherosclerotic causes of chest pain. Consider preventive therapy and risk factor modification.  Annabella Scarce, MD  Electronically Signed: By: Annabella Scarce M.D. On: 03/27/2024 15:06     ______________________________________________________________________________________________      Risk Assessment/Calculations {Does this patient have ATRIAL FIBRILLATION?:[phone]} No BP recorded.  {Refresh Note OR Click here to enter BP  :1}***       Physical Exam VS:  There were no vitals taken for this visit.       Wt Readings from Last 3 Encounters:  03/18/24 176 lb 12.9 oz (80.2 kg)  02/05/19 156 lb (70.8 kg)  10/17/18 170 lb (77.1 kg)    GEN: Well nourished, well developed in no acute distress NECK: No JVD; No carotid bruits CARDIAC: ***RRR, no murmurs, rubs, gallops RESPIRATORY:  Clear to auscultation without rales, wheezing or rhonchi  ABDOMEN: Soft, non-tender, non-distended EXTREMITIES:  No edema; No deformity  ASSESSMENT AND PLAN CAD -  Hypertension -  Abnormal echo -       {Are you ordering a CV Procedure (e.g. stress test, cath, DCCV, TEE, etc)?   Press F2        :[id number]}  Dispo: ***  Signed, Delon JAYSON Hoover, NP

## 2024-05-01 ENCOUNTER — Ambulatory Visit: Attending: Cardiology | Admitting: Cardiology

## 2024-05-01 DIAGNOSIS — I10 Essential (primary) hypertension: Secondary | ICD-10-CM

## 2024-05-01 DIAGNOSIS — I2510 Atherosclerotic heart disease of native coronary artery without angina pectoris: Secondary | ICD-10-CM

## 2024-05-01 DIAGNOSIS — R931 Abnormal findings on diagnostic imaging of heart and coronary circulation: Secondary | ICD-10-CM

## 2024-05-04 ENCOUNTER — Encounter: Payer: Self-pay | Admitting: Cardiology

## 2024-05-05 ENCOUNTER — Other Ambulatory Visit: Payer: Self-pay

## 2024-05-05 ENCOUNTER — Ambulatory Visit: Admission: EM | Admit: 2024-05-05 | Discharge: 2024-05-05 | Disposition: A | Discharge status: Home / Self Care

## 2024-05-05 ENCOUNTER — Encounter: Payer: Self-pay | Admitting: *Deleted

## 2024-05-05 DIAGNOSIS — R112 Nausea with vomiting, unspecified: Secondary | ICD-10-CM | POA: Diagnosis not present

## 2024-05-05 DIAGNOSIS — I10 Essential (primary) hypertension: Secondary | ICD-10-CM | POA: Diagnosis not present

## 2024-05-05 DIAGNOSIS — E1169 Type 2 diabetes mellitus with other specified complication: Secondary | ICD-10-CM

## 2024-05-05 LAB — GLUCOSE, POCT (MANUAL RESULT ENTRY): POCT Glucose (KUC): 146 mg/dL — AB (ref 70–99)

## 2024-05-05 MED ORDER — CLONIDINE HCL 0.1 MG PO TABS
0.1000 mg | ORAL_TABLET | Freq: Once | ORAL | Status: AC
  Administered 2024-05-05: 0.1 mg via ORAL

## 2024-05-05 MED ORDER — METOPROLOL TARTRATE 25 MG PO TABS: 25.0000 mg | ORAL_TABLET | Freq: Two times a day (BID) | ORAL | 11 refills | Status: AC

## 2024-05-05 MED ORDER — METOPROLOL TARTRATE 50 MG PO TABS
50.0000 mg | ORAL_TABLET | Freq: Once | ORAL | Status: AC
  Administered 2024-05-05: 50 mg via ORAL

## 2024-05-05 MED ORDER — BLOOD GLUCOSE MONITOR SYSTEM W/DEVICE KIT: 1.0000 | PACK | Freq: Three times a day (TID) | 0 refills | Status: AC

## 2024-05-05 MED ORDER — ONDANSETRON 4 MG PO TBDP: 4.0000 mg | ORAL_TABLET | Freq: Three times a day (TID) | ORAL | 0 refills | Status: AC | PRN

## 2024-05-05 MED ORDER — ONDANSETRON 4 MG PO TBDP
4.0000 mg | ORAL_TABLET | Freq: Once | ORAL | Status: AC
  Administered 2024-05-05: 4 mg via ORAL

## 2024-05-05 NOTE — ED Triage Notes (Signed)
 Pt reports n/v/d since Wednesday, still had multiple episodes today. States not sure if it something I ate. He has tried imodium  and pepto-bismal without relief.

## 2024-05-05 NOTE — ED Notes (Signed)
 Pt able to tolerate drinking water for PO challenge

## 2024-05-05 NOTE — Discharge Instructions (Signed)
 Schedule to see your Physician for recheck.  Return if any problems.

## 2024-05-09 NOTE — ED Provider Notes (Signed)
 [facility]    CSN: [id number] Arrival date & time: 05/05/24  1442      History   Chief Complaint Chief Complaint  Patient presents with   Emesis   Diarrhea    HPI Dave Moran is a 58 y.o. male.   Pt reports he has had diarrhea for 2 days.  Pt had vomiting yesterday. Vomiting has resolved. Pt took Pepto and imodium  without relief.  Pt felt like it was something he ate.  Pt denies fever or chills.  Pt has a history of elevated blood pressure.  Pt reports taking his medications this am.  Pt reports vomiting yesterday.  Pt denies any chest pain. No shortness of breath.  No abdominal pain.  Pt reports he has not had any chest pain.  No fever no chills.   The history is provided by the patient. No language interpreter was used.  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 Authorizing Provider  acetaminophen  (TYLENOL ) 500 MG tablet Take 500 mg by mouth every 6 (six) hours as needed for mild pain (pain score 1-3).   Yes [provider]  aspirin  EC 81 MG tablet Take 1 tablet (81 mg  total) by mouth daily. Swallow whole. 03/19/24 06/17/24 Yes Perri DELENA Meliton Mickey., MD  atorvastatin  (LIPITOR) 20 MG tablet Take 1 tablet (20 mg total) by mouth daily. 03/18/24 06/16/24 Yes Perri DELENA Meliton Mickey., MD  Cholecalciferol 50 MCG (2000 UT) TABS Take 50 mcg by mouth. 04/04/24  Yes [provider]  Empagliflozin-metFORMIN  HCl ER 12.11-998 MG TB24 Take by mouth. 04/04/24  Yes [provider]  FLUoxetine  (PROZAC ) 20 MG capsule Take 1 capsule (20 mg total) by mouth daily. Follow with your PCP for refills 03/19/24 05/05/24 Yes Perri DELENA Meliton Mickey., MD  glipiZIDE  (GLUCOTROL ) 5 MG tablet Take 1 tablet (5 mg total) by mouth daily before breakfast. Follow with your outpatient PCP for adjustments to your regimen 03/18/24 06/16/24 Yes Perri DELENA Meliton Mickey., MD  hydrOXYzine  (VISTARIL ) 25 MG capsule Take by mouth. 04/05/24  Yes [provider]  losartan  (COZAAR ) 50 MG tablet Take 1 tablet (50 mg total) by mouth daily. 03/18/24 06/16/24 Yes Perri DELENA Meliton Mickey., MD  metFORMIN  (GLUCOPHAGE ) 500 MG tablet Take 1 tablet (500 mg total) by mouth 2 (two) times daily with a meal. Titrate as instructed in discharge instructions 03/18/24 06/16/24 Yes Perri DELENA Meliton Mickey., MD  metoprolol  tartrate (LOPRESSOR ) 25 MG tablet Take 1 tablet (25 mg total) by mouth 2 (two) times daily. 05/05/24 05/05/25 Yes  Britain Anagnos K, PA-C  ondansetron  (ZOFRAN -ODT) 4 MG disintegrating tablet Take 1 tablet (4 mg total) by mouth every 8 (eight) hours as needed for nausea or vomiting. 05/05/24  Yes Marbin Olshefski K, PA-C  prazosin  (MINIPRESS ) 2 MG capsule Take 1 capsule (2 mg total) by mouth at bedtime. 03/19/24 06/17/24 Yes Perri DELENA Meliton Mickey., MD  Blood Glucose Monitoring Suppl (BLOOD GLUCOSE MONITOR SYSTEM) w/Device KIT Use to check blood glucose 3 times a day 05/05/24   Flint Raring K, PA-C  Glucose Blood (BLOOD GLUCOSE TEST STRIPS) STRP Use to check blood sugar 3 (three) times daily. 03/18/24   Perri DELENA Meliton Mickey., MD   Lancet Device MISC 1 each by Does not apply route 3 (three) times daily. May dispense any manufacturer covered by patient's insurance. 03/18/24   Perri DELENA Meliton Mickey., MD    Family History Family History  Problem Relation Age of Onset   Asthma Mother     Social History Social History   Tobacco Use   Smoking status: Every Day    Current packs/day: 0.15    Types: Cigarettes   Smokeless tobacco: Never  Vaping Use   Vaping status: Never Used  Substance Use Topics   Alcohol use: Yes    Comment: beer occasional   Drug use: Not Currently    Comment: opiates, but is taking Methadone at treatment Center     Allergies   Mushroom and Shellfish allergy   Review of Systems Review of Systems  Gastrointestinal:  Positive for diarrhea and vomiting.  All other systems reviewed and are negative.    Physical Exam Triage Vital Signs ED Triage Vitals  Encounter Vitals Group     BP 05/05/24 1501 (!) 229/118     Girls Systolic BP Percentile --      Girls Diastolic BP Percentile --      Boys Systolic BP Percentile --      Boys Diastolic BP Percentile --      Pulse Rate 05/05/24 1501 73     Resp 05/05/24 1501 18     Temp 05/05/24 1501 (!) 97.2 F (36.2 C)     Temp Source 05/05/24 1501 Oral     SpO2 05/05/24 1501 98 %     Weight --      Height --      Head Circumference --      Peak Flow --      Pain Score 05/05/24 1455 0     Pain Loc --      Pain Education --      Exclude from Growth Chart --    No data found.  Updated Vital Signs BP (!) 194/98 (BP Location: Right Arm)   Pulse 70   Temp (!) 97.2 F (36.2 C) (Oral)   Resp 18   SpO2 96%   Visual Acuity Right Eye Distance:   Left Eye Distance:   Bilateral Distance:    Right Eye Near:   Left Eye Near:    Bilateral Near:     Physical Exam Vitals and nursing note reviewed.  Constitutional:      Appearance: He is well-developed.  HENT:     Head: Normocephalic.     Nose: Nose normal.     Mouth/Throat:      Mouth: Mucous membranes are moist.  Cardiovascular:     Rate and Rhythm: Normal rate.  Pulmonary:     Effort: Pulmonary effort is normal.  Abdominal:     General: There is  no distension.  Musculoskeletal:        General: Normal range of motion.     Cervical back: Normal range of motion.  Skin:    General: Skin is warm.  Neurological:     General: No focal deficit present.     Mental Status: He is alert and oriented to person, place, and time.      UC Treatments / Results  Labs (all labs ordered are listed, but only abnormal results are displayed) Labs Reviewed  GLUCOSE, POCT (MANUAL RESULT ENTRY) - Abnormal; Notable for the following components:      Result Value   POCT Glucose ([facility]) 146 (*)    All other components within normal limits    EKG   Radiology No results found.  Procedures Procedures (including critical care time)  Medications Ordered in UC Medications  ondansetron  (ZOFRAN -ODT) disintegrating tablet 4 mg (4 mg Oral Given 05/05/24 1600)  metoprolol  tartrate (LOPRESSOR ) tablet 50 mg (50 mg Oral Given 05/05/24 1559)  cloNIDine  (CATAPRES ) tablet 0.1 mg (0.1 mg Oral Given 05/05/24 1558)    Initial Impression / Assessment and Plan / UC Course  I have reviewed the triage vital signs and the nursing notes.  Pertinent labs & imaging results that were available during my care of the patient were reviewed by me and considered in my medical decision making (see chart for details).     Pt given zofran  for nausea and clonidine  and metoprolol .  Pt reports feeling much better.  Pt drinking fluids.  Pt advised to take his evening medications. Pt's blood pressure improving.   Final Clinical Impressions(s) / UC Diagnoses   Final diagnoses:  Type 2 diabetes mellitus with other specified complication, without long-term current use of insulin  (HCC)  Essential hypertension  Nausea and vomiting, unspecified vomiting type     Discharge Instructions      Schedule to  see your Physician for recheck.  Return if any problems.    ED Prescriptions     Medication Sig Dispense Auth. Provider   Blood Glucose Monitoring Suppl (BLOOD GLUCOSE MONITOR SYSTEM) w/Device KIT Use to check blood glucose 3 times a day 1 kit Lumi Winslett K, PA-C   metoprolol  tartrate (LOPRESSOR ) 25 MG tablet Take 1 tablet (25 mg total) by mouth 2 (two) times daily. 60 tablet Kenidi Elenbaas K, PA-C   ondansetron  (ZOFRAN -ODT) 4 MG disintegrating tablet Take 1 tablet (4 mg total) by mouth every 8 (eight) hours as needed for nausea or vomiting. 20 tablet Kijana Estock K, PA-C      PDMP not reviewed this encounter. An After Visit Summary was printed and given to the patient.       Flint Sonny POUR, PA-C 05/09/24 1536

## 2024-05-14 ENCOUNTER — Emergency Department (HOSPITAL_COMMUNITY)
Admission: EM | Admit: 2024-05-14 | Discharge: 2024-05-14 | Disposition: A | Discharge status: Home / Self Care | Attending: Emergency Medicine | Admitting: Emergency Medicine

## 2024-05-14 ENCOUNTER — Other Ambulatory Visit: Payer: Self-pay

## 2024-05-14 ENCOUNTER — Emergency Department (HOSPITAL_COMMUNITY)

## 2024-05-14 ENCOUNTER — Encounter (HOSPITAL_COMMUNITY): Payer: Self-pay | Admitting: *Deleted

## 2024-05-14 DIAGNOSIS — S0083XA Contusion of other part of head, initial encounter: Secondary | ICD-10-CM | POA: Insufficient documentation

## 2024-05-14 DIAGNOSIS — R030 Elevated blood-pressure reading, without diagnosis of hypertension: Secondary | ICD-10-CM

## 2024-05-14 DIAGNOSIS — I10 Essential (primary) hypertension: Secondary | ICD-10-CM | POA: Diagnosis not present

## 2024-05-14 DIAGNOSIS — Z79899 Other long term (current) drug therapy: Secondary | ICD-10-CM | POA: Insufficient documentation

## 2024-05-14 DIAGNOSIS — Z23 Encounter for immunization: Secondary | ICD-10-CM | POA: Insufficient documentation

## 2024-05-14 DIAGNOSIS — E119 Type 2 diabetes mellitus without complications: Secondary | ICD-10-CM | POA: Diagnosis not present

## 2024-05-14 DIAGNOSIS — R519 Headache, unspecified: Secondary | ICD-10-CM | POA: Diagnosis present

## 2024-05-14 DIAGNOSIS — Z7984 Long term (current) use of oral hypoglycemic drugs: Secondary | ICD-10-CM | POA: Diagnosis not present

## 2024-05-14 DIAGNOSIS — Z7982 Long term (current) use of aspirin: Secondary | ICD-10-CM | POA: Diagnosis not present

## 2024-05-14 DIAGNOSIS — W01198A Fall on same level from slipping, tripping and stumbling with subsequent striking against other object, initial encounter: Secondary | ICD-10-CM | POA: Insufficient documentation

## 2024-05-14 MED ORDER — CLONIDINE HCL 0.2 MG PO TABS
0.2000 mg | ORAL_TABLET | Freq: Once | ORAL | Status: AC
Start: 2024-05-14 — End: 2024-05-14
  Administered 2024-05-14: 0.2 mg via ORAL
  Filled 2024-05-14: qty 1

## 2024-05-14 MED ORDER — TETANUS-DIPHTH-ACELL PERTUSSIS 5-2-15.5 LF-MCG/0.5 IM SUSP
0.5000 mL | Freq: Once | INTRAMUSCULAR | Status: AC
Start: 2024-05-14 — End: 2024-05-14
  Administered 2024-05-14: 0.5 mL via INTRAMUSCULAR
  Filled 2024-05-14: qty 0.5

## 2024-05-14 MED ORDER — LIDOCAINE-EPINEPHRINE (PF) 2 %-1:200000 IJ SOLN
10.0000 mL | Freq: Once | INTRAMUSCULAR | Status: DC
  Filled 2024-05-14: qty 20

## 2024-05-14 MED ORDER — AMLODIPINE BESYLATE 5 MG PO TABS: 5.0000 mg | ORAL_TABLET | Freq: Every day | ORAL | 1 refills | Status: AC

## 2024-05-14 NOTE — ED Triage Notes (Signed)
 The pt stumbled and fell last pm in his bedroom face first into the tv stand  he has a laceration between his eyes that appears to have needed sutures then  but he did not realize it until this am  no active bleeding at present no other iinjuries

## 2024-05-14 NOTE — ED Notes (Signed)
 Patient transported to CT

## 2024-05-14 NOTE — ED Provider Notes (Signed)
 [place] EMERGENCY DEPARTMENT AT [facility] Provider Note   CSN: [id number] Arrival date & time: 05/14/24  1651     Patient presents with: Fall and Facial Injury   Dave Moran is a 58 y.o. male patient with past medical history of hypertension, hyperlipidemia, diabetes presents to emergency room with complaint of fall.  Patient reports that he was walking in the dark yesterday and tripped over a shoe.  He fell forward striking his head against a TV stand.  He is not on blood thinner, he had no loss of consciousness, he has no focal deficit.  He does endorse that he has frontal headache and right sided facial pain.  He has a laceration between his eyebrows.  He is unsure of last tetanus shot.  He reports he took his blood pressure medicine this morning.    Fall  Facial Injury      Prior to Admission medications   Medication Sig Start Date End Date Taking? Authorizing Provider  aspirin  EC 81 MG tablet Take 1 tablet (81 mg total) by mouth daily. Swallow whole. 03/19/24 06/17/24 Yes Perri DELENA Meliton Mickey., MD  atorvastatin  (LIPITOR) 20 MG tablet Take 1 tablet (20 mg total) by mouth daily. 03/18/24 06/16/24 Yes Perri DELENA Meliton Mickey., MD  Cholecalciferol 50 MCG (2000 UT) TABS Take 50 mcg by mouth daily. 04/04/24  Yes [provider]  Empagliflozin-metFORMIN  HCl ER 12.11-998 MG TB24 Take 1 tablet by mouth daily. 04/04/24  Yes [provider]  FLUoxetine  (PROZAC ) 20 MG capsule Take 1 capsule (20 mg total) by mouth daily. Follow with your PCP for refills 03/19/24 05/14/24 Yes Perri DELENA Meliton Mickey., MD  glipiZIDE  (GLUCOTROL ) 5 MG tablet Take 1 tablet (5 mg total) by mouth daily before breakfast. Follow with your outpatient PCP for adjustments to your regimen 03/18/24 06/16/24 Yes Perri DELENA Meliton Mickey., MD  hydrOXYzine  (VISTARIL ) 25 MG capsule Take 25 mg by mouth at bedtime. 04/05/24  Yes [provider]  ibuprofen  (ADVIL ) 200 MG tablet Take 200 mg by mouth every 6  (six) hours as needed for mild pain (pain score 1-3).   Yes [provider]  losartan  (COZAAR ) 50 MG tablet Take 1 tablet (50 mg total) by mouth daily. Patient taking differently: Take 100 mg by mouth daily. 03/18/24 06/16/24 Yes Perri DELENA Meliton Mickey., MD  metFORMIN  (GLUCOPHAGE ) 500 MG tablet Take 1 tablet (500 mg total) by mouth 2 (two) times daily with a meal. Titrate as instructed in discharge instructions 03/18/24 06/16/24 Yes Perri DELENA Meliton Mickey., MD  metoprolol  tartrate (LOPRESSOR ) 25 MG tablet Take 1 tablet (25 mg total) by mouth 2 (two) times daily. 05/05/24 05/05/25 Yes Flint Sonny POUR, PA-C  ondansetron  (ZOFRAN -ODT) 4 MG disintegrating tablet Take 1 tablet (4 mg total) by mouth every 8 (eight) hours as needed for nausea or vomiting. 05/05/24  Yes Flint Sonny K, PA-C  prazosin  (MINIPRESS ) 2 MG capsule Take 1 capsule (2 mg total) by mouth at bedtime. 03/19/24 06/17/24 Yes Perri DELENA Meliton Mickey., MD  Blood Glucose Monitoring Suppl (BLOOD GLUCOSE MONITOR SYSTEM) w/Device KIT Use to check blood glucose 3 times a day 05/05/24   Sofia, Leslie K, PA-C  FLUoxetine  (PROZAC ) 40 MG capsule Take 40 mg by mouth daily. Patient not taking: Reported on 05/14/2024 04/05/24   [provider]  Glucose Blood (BLOOD GLUCOSE TEST STRIPS) STRP Use to check blood sugar 3 (three) times daily. 03/18/24   Perri DELENA Meliton Mickey., MD  Lancet Device MISC 1  each by Does not apply route 3 (three) times daily. May dispense any manufacturer covered by patient's insurance. 03/18/24   Perri DELENA Meliton Mickey., MD    Allergies: Mushroom and Shellfish allergy    Review of Systems  Skin:  Positive for wound.    Updated Vital Signs BP (!) 219/109   Pulse 93   Temp (!) 97.4 F (36.3 C) (Oral)   Resp 16   Ht 5' 7 (1.702 m)   Wt 80.2 kg   SpO2 98%   BMI 27.69 kg/m   Physical Exam Vitals and nursing note reviewed.  Constitutional:      General: He is not in acute distress.    Appearance: He is not  toxic-appearing.  HENT:     Head: Normocephalic and atraumatic.     Mouth/Throat:     Comments: Normal eye ROM Eyes:     General: No scleral icterus.    Conjunctiva/sclera: Conjunctivae normal.  Cardiovascular:     Rate and Rhythm: Normal rate and regular rhythm.     Pulses: Normal pulses.     Heart sounds: Normal heart sounds.  Pulmonary:     Effort: Pulmonary effort is normal. No respiratory distress.     Breath sounds: Normal breath sounds.  Abdominal:     General: Abdomen is flat. Bowel sounds are normal.     Palpations: Abdomen is soft.     Tenderness: There is no abdominal tenderness.  Skin:    General: Skin is warm and dry.     Findings: No lesion.     Comments: 2cm lac to forehead   Neurological:     General: No focal deficit present.     Mental Status: He is alert and oriented to person, place, and time. Mental status is at baseline.     Comments: No focal deficit.      (all labs ordered are listed, but only abnormal results are displayed) Labs Reviewed - No data to display  EKG: None  Radiology: No results found.   Procedures   Medications Ordered in the ED  lidocaine -EPINEPHrine (XYLOCAINE  W/EPI) 2 %-1:200000 (PF) injection 10 mL (has no administration in time range)  Tdap (ADACEL) injection 0.5 mL (0.5 mLs Intramuscular Given 05/14/24 1752)  cloNIDine  (CATAPRES ) tablet 0.2 mg (0.2 mg Oral Given 05/14/24 1752)    Clinical Course as of 05/14/24 1911  Sun May 14, 2024  1903 BP rechecked in room now 185 systolic, improved after medicine here.  [JB]    Clinical Course User Index [JB] [place] Duerst, Warren SAILOR, PA-C                                 Medical Decision Making Amount and/or Complexity of Data Reviewed Radiology: ordered.  Risk Prescription drug management.   This patient presents to the ED for concern of headache, this involves an extensive number of treatment options, and is a complaint that carries with it a high risk of complications and  morbidity.  The differential diagnosis includes tension headache, migraine, intracranial mass, intracranial hemorrhage, intracranial infection including meningitis vs encephalitis, trigeminal neuralgia, AVM, sinusitis, cerebral aneurysm, muscular headache, cavernous sinus thrombosis, carotid artery dissection.    Imaging Studies ordered:  I ordered imaging studies including head CT, maxface CT I independently visualized and interpreted imaging which showed no acute findings  I agree with the radiologist interpretation    Problem List / ED Course / Critical interventions /  Medication management  Reports to emergency room approximately 13 hours after a fall.  He had mechanical fall hitting his head.  He has no other injuries.  He reports no focal neurological deficit, no seizure or altered mental status after the fall.  He does have a laceration between his eyebrows.  He is unsure of tetanus shot so he will receive this here.  I have ordered a CT scan of maxillofacial and CT scan of the head.  We had shared decision making with her not would like to proceed with laceration repair due to duration of time and this is already started to scab together.  Patient prefers to not have laceration repair.  Area will be washed with gentle soap and water and bacitracin applied. CT scan is overall reassuring. Patient also found to have elevated blood pressure.  He reports that he has taken his medicine.  He denies any chest pain shortness of breath. I ordered medication including tdap Reevaluation of the patient after these medicines showed that the patient improved I have reviewed the patients home medicines and have made adjustments as needed. Patient's blood pressure was elevated but did respond well to medicine here.  I will start him on amlodipine once daily.  Workup overall reassuring.  Stable throughout stay feel appropriate for discharge with close outpatient follow-up.        Final diagnoses:   Contusion of face, initial encounter  Elevated blood pressure reading    ED Discharge Orders          Ordered    amLODipine (NORVASC) 5 MG tablet  Daily        05/14/24 1903               Linzie Criss N, PA-C 05/14/24 1912    Towana Ozell BROCKS, MD 05/15/24 [phone]

## 2024-05-14 NOTE — Discharge Instructions (Signed)
 Please continue taking losartan  and metoprolol  for blood pressure control.  I am going to start you on a low-dose of amlodipine as well please take as prescribed.  Please monitor blood pressure at home and follow-up with primary care for recheck.  For wound care I would recommend cleaning with gentle soap and water and you can also apply Neosporin or bacitracin.  Please return to emergency room if you have signs of infection or worsening symptoms.

## 2024-05-14 NOTE — ED Notes (Signed)
 Suture cart at bedside

## 2024-05-14 NOTE — ED Notes (Signed)
 Pt completely assessed by EDP and set to discharge. No RN assessment performed.  Pt provided discharge instructions and prescription information. Pt was given the opportunity to ask questions and questions were answered.
